# Patient Record
Sex: Male | Born: 1935 | Race: White | Hispanic: No | Smoking: Never smoker
Health system: Southern US, Community
[De-identification: ages and names within clinical notes are randomized; demographics above are authoritative.]

## PROBLEM LIST (undated history)

## (undated) DIAGNOSIS — M1990 Unspecified osteoarthritis, unspecified site: Secondary | ICD-10-CM

## (undated) DIAGNOSIS — G629 Polyneuropathy, unspecified: Secondary | ICD-10-CM

## (undated) DIAGNOSIS — M545 Low back pain, unspecified: Secondary | ICD-10-CM

## (undated) DIAGNOSIS — I10 Essential (primary) hypertension: Secondary | ICD-10-CM

## (undated) DIAGNOSIS — R2689 Other abnormalities of gait and mobility: Secondary | ICD-10-CM

## (undated) DIAGNOSIS — H9190 Unspecified hearing loss, unspecified ear: Secondary | ICD-10-CM

## (undated) DIAGNOSIS — L570 Actinic keratosis: Secondary | ICD-10-CM

## (undated) HISTORY — PX: OTHER SURGICAL HISTORY: SHX169

## (undated) HISTORY — DX: Actinic keratosis: L57.0

## (undated) HISTORY — DX: Polyneuropathy, unspecified: G62.9

## (undated) HISTORY — DX: Low back pain, unspecified: M54.50

## (undated) HISTORY — DX: Other abnormalities of gait and mobility: R26.89

## (undated) HISTORY — DX: Low back pain: M54.5

---

## 1958-05-28 HISTORY — PX: KNEE ARTHROSCOPY: SHX127

## 2005-05-28 HISTORY — PX: BACK SURGERY: SHX140

## 2005-07-26 ENCOUNTER — Encounter: Admission: RE | Admit: 2005-07-26 | Discharge: 2005-07-26 | Payer: Self-pay | Admitting: Orthopedic Surgery

## 2005-09-05 ENCOUNTER — Inpatient Hospital Stay (HOSPITAL_COMMUNITY): Admission: RE | Admit: 2005-09-05 | Discharge: 2005-09-06 | Payer: Self-pay | Admitting: Orthopedic Surgery

## 2006-05-28 HISTORY — PX: TOTAL HIP ARTHROPLASTY: SHX124

## 2006-05-28 HISTORY — PX: CATARACT EXTRACTION: SUR2

## 2006-12-30 IMAGING — CR DG LUMBAR SPINE 2-3V
2 series · 2 of 2 positions shown · non-contrast
Comparison: none

CLINICAL DATA: Spinal stenosis L2-S1.
 LUMBAR SPINE ? 1 VIEW:

[view not recorded (1 of 2)]
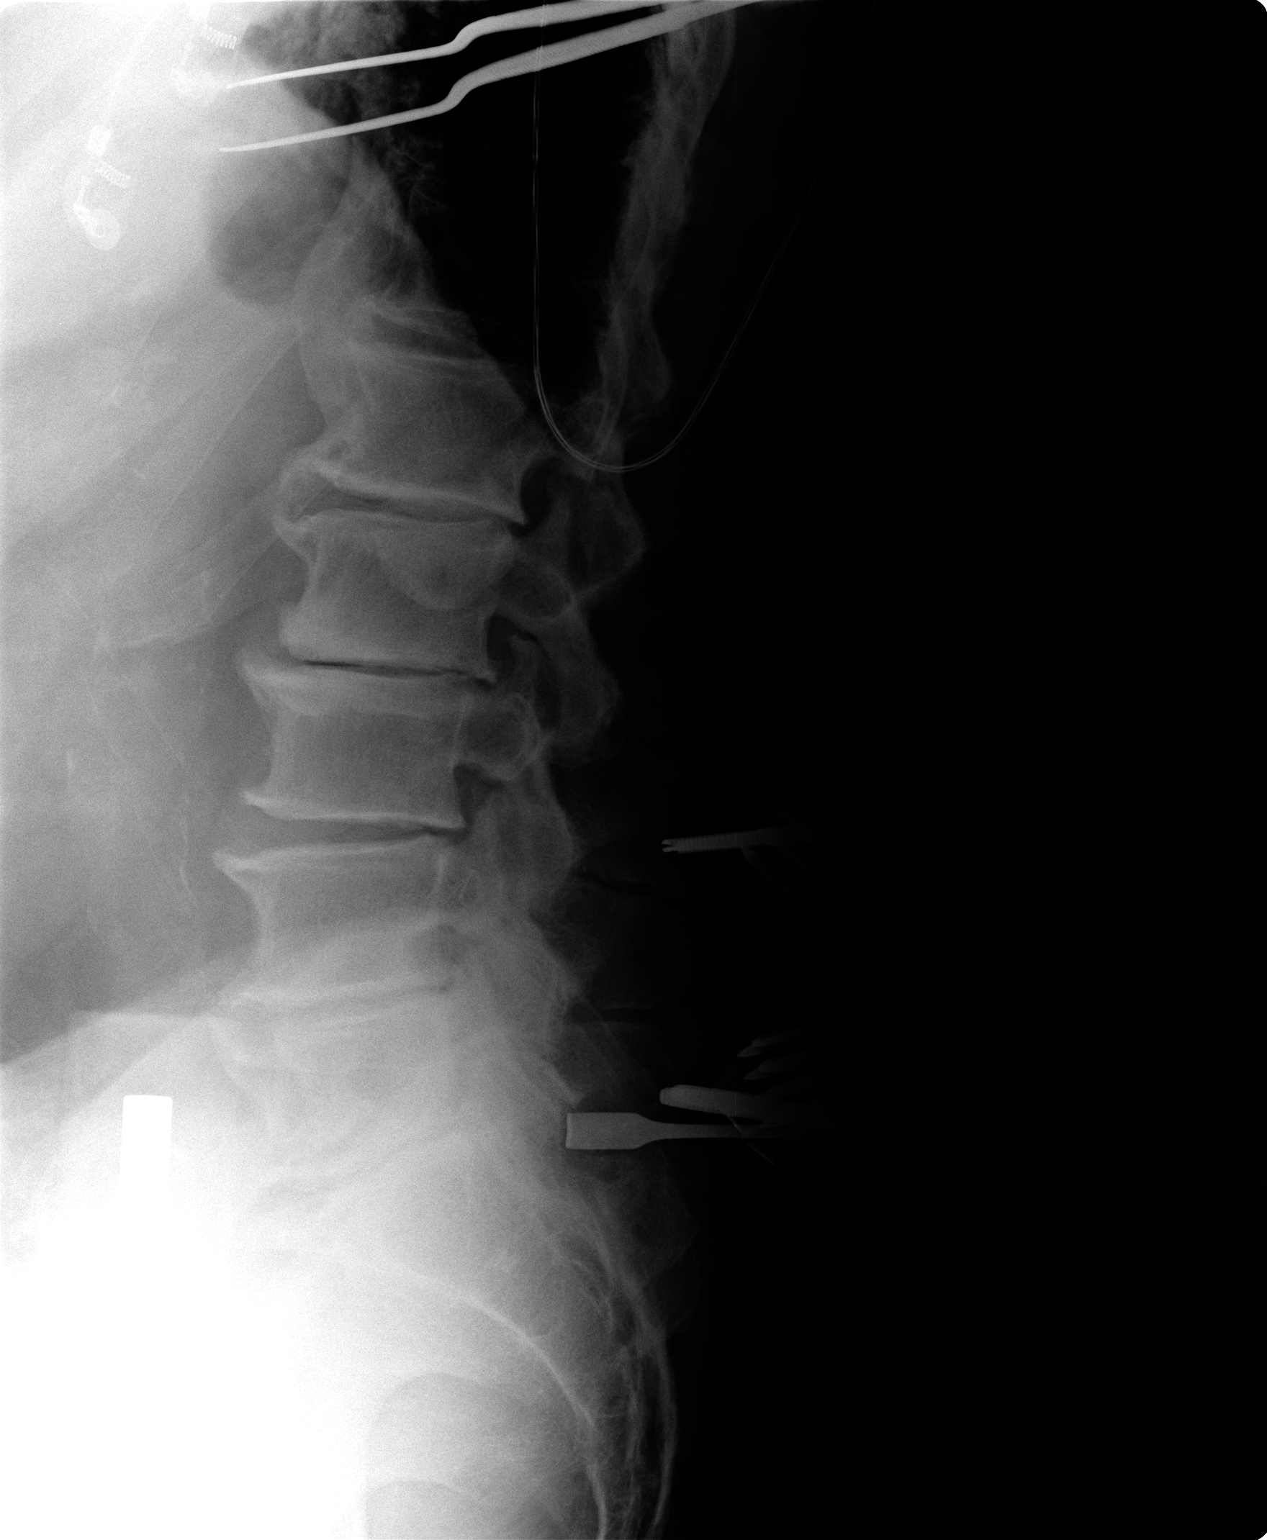

[view not recorded (2 of 2)]
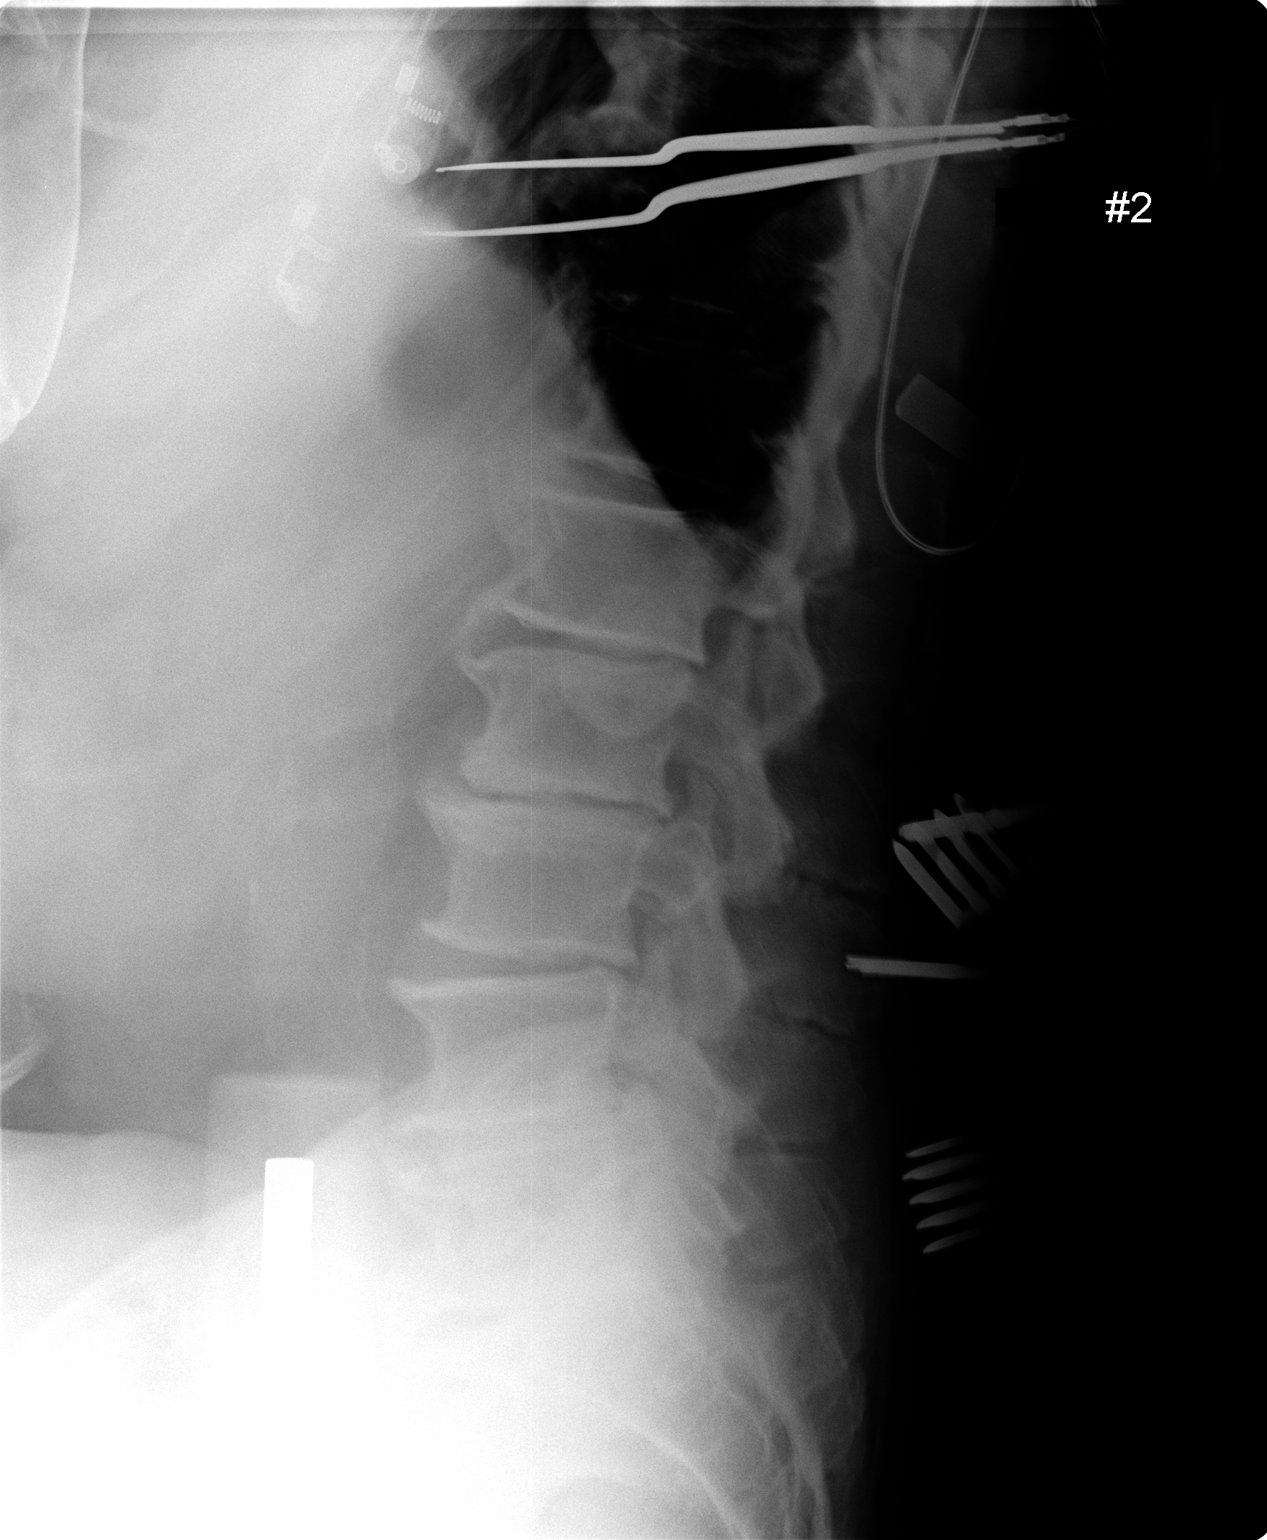

[2 of 2 positions shown; findings below may reference images not displayed]

FINDINGS: Retractors in place and surgical instruments located posteriorly, aimed at L3-4 and L5-S1 interspaces.
IMPRESSION: OR localization at L3-4 and L5-S1.

## 2007-02-05 ENCOUNTER — Ambulatory Visit (HOSPITAL_COMMUNITY): Admission: RE | Admit: 2007-02-05 | Discharge: 2007-02-05 | Payer: Self-pay | Admitting: Orthopedic Surgery

## 2007-08-01 ENCOUNTER — Inpatient Hospital Stay (HOSPITAL_COMMUNITY): Admission: RE | Admit: 2007-08-01 | Discharge: 2007-08-05 | Payer: Self-pay | Admitting: Orthopedic Surgery

## 2008-11-24 IMAGING — CR DG HIP 1V PORT*R*
1 series · 1 of 1 positions shown · non-contrast
Comparison: none

CLINICAL DATA: Right total hip replacement for osteoarthritis.

RIGHT HIP - SINGLE PORTABLE AP VIEW

[view not recorded]
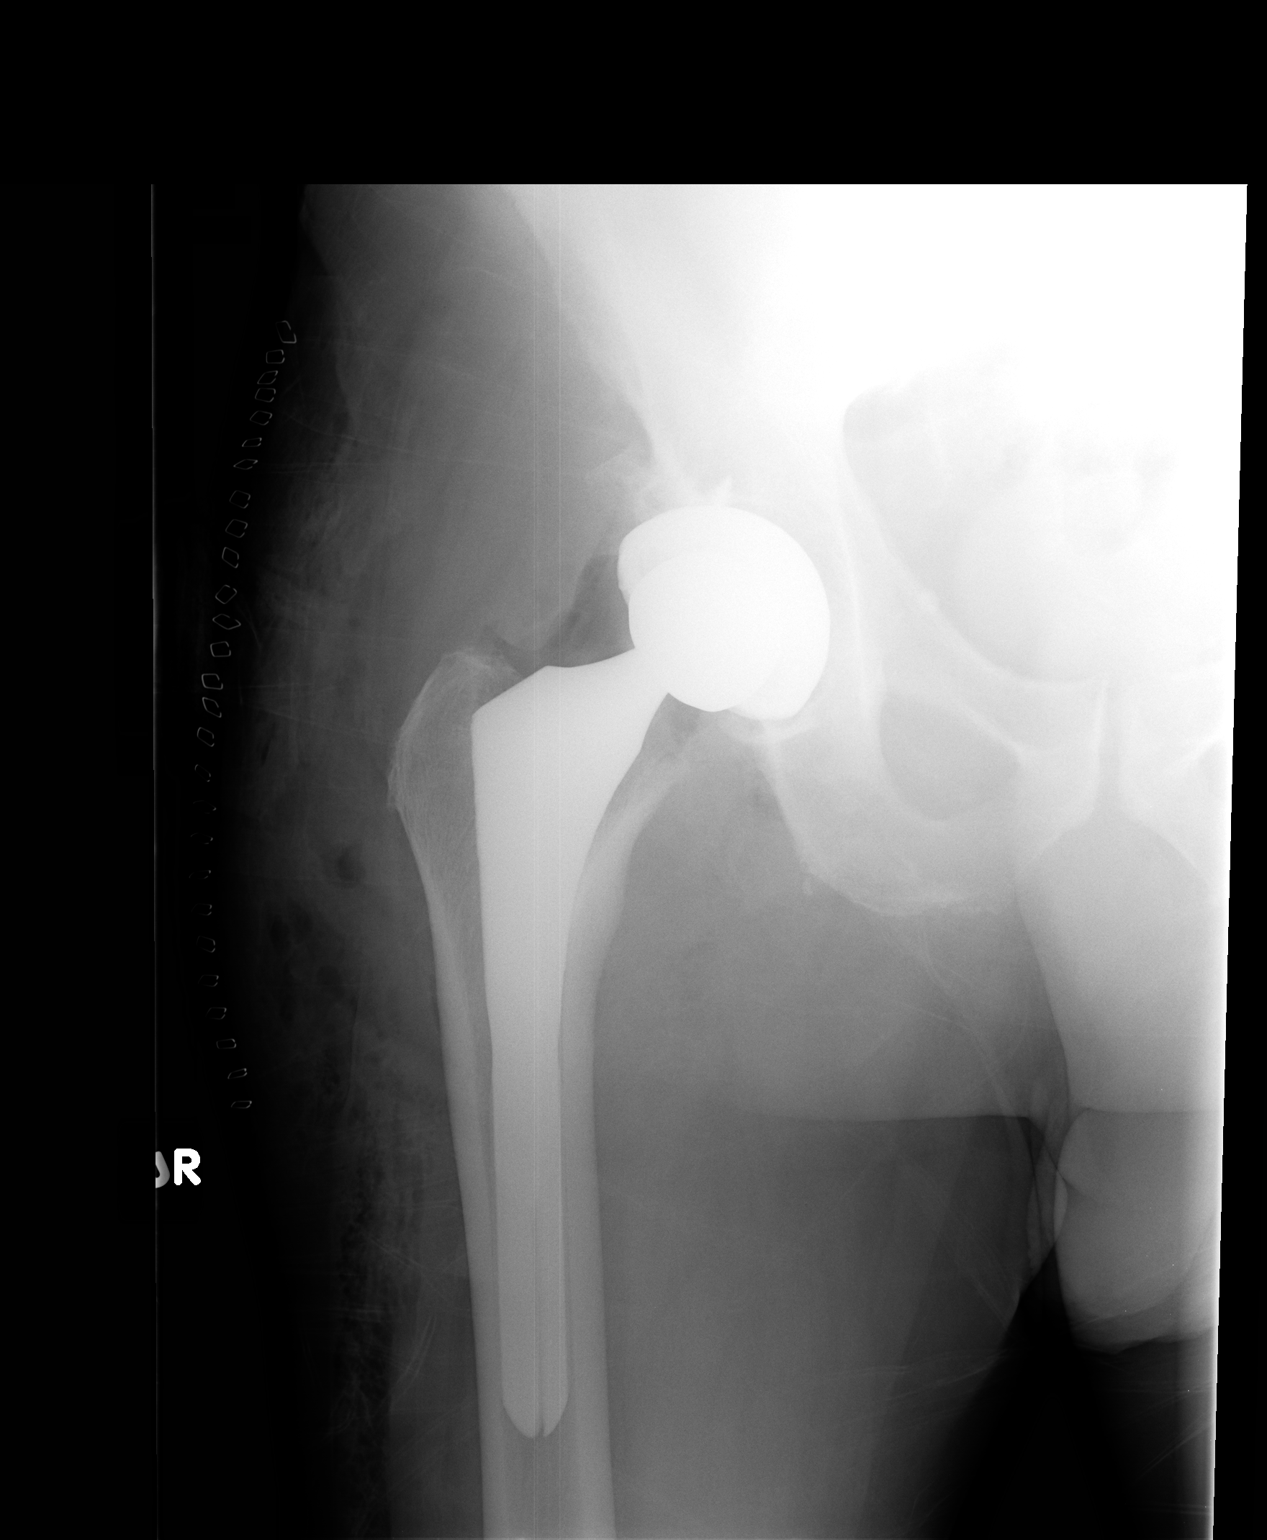

[1 of 1 positions shown; findings below may reference images not displayed]

FINDINGS: Right total hip prosthesis in satisfactory position and alignment. No
fracture or dislocation seen.

IMPRESSION

Satisfactory postoperative appearance of a right total hip prosthesis.

## 2010-05-26 ENCOUNTER — Emergency Department: Payer: Self-pay | Admitting: Emergency Medicine

## 2010-05-28 HISTORY — PX: CARPAL TUNNEL RELEASE: SHX101

## 2010-10-10 NOTE — Op Note (Signed)
Shane Prince, Shane Prince               ACCOUNT NO.:  [id number]   MEDICAL RECORD NO.:  [id number]          PATIENT TYPE:  INP   LOCATION:  1616                         FACILITY:  [facility]   PHYSICIAN:  Marlowe Kays, M.D.  DATE OF BIRTH:  [birth year]   DATE OF PROCEDURE:  08/01/2007  DATE OF DISCHARGE:                               OPERATIVE REPORT   PREOPERATIVE DIAGNOSIS:  Osteoarthritis, right hip.   POSTOPERATIVE DIAGNOSIS:  Osteoarthritis, right hip.   OPERATION:  Osteonics total hip replacement, right.   SURGEON:  Marlowe Kays, M.D.   ASSISTANT:  Georges Lynch. Darrelyn Hillock, M.D.   ANESTHESIA:  General.   INDICATIONS FOR PROCEDURE:  A completely obliterated right hip joint  with significant pain and loss of motion.   PROCEDURE:  Prophylactic antibiotics.  Satisfactory general anesthesia.  Left lateral decubitus position.  Mark II frame.  Right hip was prepped  with DuraPrep and draped in a sterile field.  A time-out performed.  Ioban employed.  Posterolateral incision down through the fascia lata.  External rotators were detached from the femur.  The hip capsule  exposed.  A subtotal capsulectomy performed.  The hip was dislocated.  I  made my initial cut of the femoral neck right below the femoral head.  The piriformis fossa was exposed and cleared of soft tissue.  A guide  pin was placed down through it into the femur, over-drilled with a step-  cut drill followed by a canal finder.  I then began my vertical reaming  process.  Preoperative templating indicated he probably needed a 12 mm  non-smoothing prosthesis.  Even though he is 81, he had excellent bone  stock, and I elected to go with both noncemented femur and acetabular.  I then began the rasping process, working up to #12, and along the way  made my final cut along the femoral neck a fingerbreadth above the  lesser trochanter.  I then reamed for the distal tip up to an 18.5 for  an 18 mm tip.  Moving to the acetabulum, we  then performed a complete  capsulectomy and began the deepening and expanding reaming process,  working up to a 60 mm, which is what we had templated.  I then went  through a trial reduction and found the cup positioned satisfactorily  and accordingly placed the final 60 mm fenestrated cup.  Because of his  hard acetabular bone, placed one bone screw.  I then placed the final  polyethylene liner and went through another trial reduction of a 40 mm  head and -5 neck length, which seemed to give the optimal stability and  length.  Accordingly replaced the final prosthesis, impacting it gently  and actually at this time performed the trial with a -5 head followed by  the actual 40 mm -5 C-tapered head.  The hip was reduced and found to be  nice and stable.  The wound was then irrigated with sterile saline, and  closure performed.  The buttock muscles and fascia lata were  reapproximated with #1 Vicryl.  The subcutaneous tissue with a  combination of #1  and 2-0 Vicryl, staples on the skin, Betadine, and  Adaptic, dry sterile dressings were applied.  He was then gently placed  on his back, and transferred at the same time to his PACU bed.  He was  placed in an abduction pillow.  Toes were up.  Estimated blood loss was  500 cc.  No blood replacement.  No known operative complications.           ______________________________  Marlowe Kays, M.D.     JA/MEDQ  D:  08/01/2007  T:  08/01/2007  Job:  [id number]

## 2010-10-10 NOTE — H&P (Signed)
Shane Prince, Shane Prince              ACCOUNT NO.:  [id number]   MEDICAL RECORD NO.:  [id number]         PATIENT TYPE:  LINP   LOCATION:                               FACILITY:  [facility]   PHYSICIAN:  Marlowe Kays, M.D.  DATE OF BIRTH:  08-08-1926   DATE OF ADMISSION:  08/01/2007  DATE OF DISCHARGE:                              HISTORY & PHYSICAL   CHIEF COMPLAINT:  Pain in my right hip.   PRESENT ILLNESS:  This 77 year old white male has been seen by [place] for  continuing problems concerning his right hip.  He has had progressive  pain into the hip into the thigh area.  He now has developed a limp upon  walking.  X-rays have shown near bone-on-bone deformity of the right  hip.  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  The findings of this is markedly interfering in his day-to-day  activities.  He has had a decompressive lumbar laminectomy for spinal  stenosis, which has relieved a considerable amount of his discomfort in  his back and hip area, but he is continuing with his the right hip pain.  After much discussion including the risks and benefits of surgery, it  was decided he would benefit from surgical intervention and being  admitted for total hip replacement arthroplasty.   He lives alone and maintains his own home, but he does have daughters  who live a short distance away that he can call upon.  I told that I  recommend he have them be with him for the first day or two after  surgery, for his safety.  He will have home health as well.  He already  has a three-in-one commode at home, but we will fit him with a walker  and supply him with a shower seat, during his hospitalization.   PAST MEDICAL HISTORY:  And the patient denies any medical problems.  He  does realize he has hypertension.  He takes it on his own, takes his  blood pressure on his own.  His daughter has bought him an over-the-  counter type herb medicine, Cyrulex Plus, which he has taken for  his  blood pressure.  He also has some mild hemorrhoids.  Osteoarthritis as  well.  As a child, he had mumps and measles.   PAST SURGERIES:  Lumbar laminectomies in 2007 and 2008, knee surgery in  1916 and toe surgery in 2004.   ALLERGIES:  HE DENIES ANY MEDICAL ALLERGIES.   FAMILY HISTORY:  His father died of 26.  Mother died at 69.   SOCIAL HISTORY:  The patient is retired, had never had any intake of  tobacco products, has about 6 ounces of alcohol a day.  He has three  children, lives alone.  He has asked to see if he could go to a rehab  facility, after surgery.  He lives in a one level home.   REVIEW OF SYSTEMS:  CNS:  No seizure or shoulder paralysis, numbness,  double vision.  RESPIRATORY:  No productive cough, no hemoptysis, no  shortness of breath.  CARDIOVASCULAR:  No chest pain, no angina, no  orthopnea.  GASTROINTESTINAL:  No nausea or vomiting, no melena or  bloody stool.  GENITOURINARY:  No discharge, dysuria, hematuria.  MUSCULOSKELETAL:  Primarily in present illness.   PHYSICAL EXAMINATION:  GENERAL:  Alert and cooperative friendly, fully  alert, oriented 77 year old white male who is 6 feet 1,  weighs 235  pounds.  VITAL SIGNS:  Blood pressure seated, left arm 178/98, pulse 76 regular,  respirations 12 unlabored.  HEENT:  Normocephalic, PERRLA.  Oropharynx is clear.  CHEST:  Clear to auscultation, no rhonchi, no rales.  HEART:  Regular rate and rhythm.  Heart sounds are somewhat distant, as  he is barrel-chested.  No murmurs are heard.  ABDOMEN:  Obese, soft, nontender, liver and spleen not felt.  GENITALIA:  Rectal not done, not pertinent to present illness.  EXTREMITIES:  The patient has pain with internal/external rotation of  his right hip, as well as in flexion.  NEUROVASCULAR:  Grossly intact to the right lower extremity.   ADMISSION DIAGNOSIS:  Osteoarthritis of right hip.   PLAN:  The patient to undergo right total hip replacement arthroplasty.  Will  be sure that he has proper durable medical goods for his safe home  return.  If this is not feasible, then we may seek out a rehabilitation  or skilled nursing facility for him to recuperate.      Dooley L. Cherlynn June.    ______________________________  Marlowe Kays, M.D.    DLU/MEDQ  D:  07/18/2007  T:  07/19/2007  Job:  [id number]   cc:   Marlowe Kays, M.D.  Fax: [phone]

## 2010-10-13 NOTE — Op Note (Signed)
Shane Prince, Shane Prince               ACCOUNT NO.:  [id number]   MEDICAL RECORD NO.:  [id number]          PATIENT TYPE:  INP   LOCATION:  0001                         FACILITY:  [facility]   PHYSICIAN:  Marlowe Kays, M.D.  DATE OF BIRTH:  10-28-1926   DATE OF PROCEDURE:  09/05/2005  DATE OF DISCHARGE:                                 OPERATIVE REPORT   PREOPERATIVE DIAGNOSIS:  Central and foraminal stenosis L1-L2, L2-L3, L3-L4,  L4-L5, and L5-S1.   POSTOPERATIVE DIAGNOSIS:  Central and foraminal stenosis L1-L2, L2-L3, L3-  L4, L4-L5, and L5-S1.   OPERATION:  Central lateral recess decompression L1-L2 to the sacrum.   SURGEON:  Marlowe Kays, M.D.   ASSISTANT:  Georges Lynch. Darrelyn Hillock, M.D.   ANESTHESIA:  General.   JUSTIFICATION OF THE PROCEDURE:  The patient is having back and bilateral  leg pain, more on the right thigh.  MRI has demonstrated no disk herniation  but central spinal stenosis mainly at L2-L3 and L3-L4 with foraminal  stenosis at the remaining levels. I followed this with a myelogram  confirming the above diagnoses and consequently, he is here for the above  mentioned surgery.   PROCEDURE:  Satisfactory general anesthesia, prophylactic antibiotics, Foley  catheter inserted, knee-chest position on the Andrews frame, the back was  prepped with DuraPrep and draped in a sterile field.  Ioban employed.  Vertical midline incision down to the spinous processes at this level.  I  tagged two of the spinous process with Kocher clamps and these were  tentatively identified as L3 and L5.  Based on this, I extended the incision  proximally and distally working up to what we thought was L1 and the sacrum.  The soft tissue was dissected off the neural arches and two self-retaining  McCullough retractors inserted.  I took a second x-ray with the Kocher  clamps at what we thought were L2 and L3, and lateral x-ray confirmed this.  We then began central decompression with double action  rongeur and 2 and 3  mm Kerrison rongeurs, working throughout the length of the incision up  through most of the neural arch of L2 with the spinous process of L2 having  been removed, and working down to the sacrum.  I then brought in the  microscope and we completed the decompression, making sure that all the  foramen at each level were patent.  There were no dural injuries or other  iatrogenic complications.  After we thoroughly had decompressed the  pathology, we irrigated the wound well with sterile saline and placed  Gelfoam soaked in thrombin over the dura.  I then closed the wound over a  1/4 inch Penrose drain through the right posterior hip area under direct  visualization to make sure that the drain was not included, with interrupted  #1 Vicryl in the paralumbar muscle and fascia, 2-0  Vicryl in the subcutaneous tissues, staples in the skin.  Betadine and  Adaptic dressing was applied, he was gently placed on his PACU bed, and  taken to the recovery room in stable condition with no complications.  Estimated blood  loss is perhaps 300 mL, no blood replacement.           ______________________________  Marlowe Kays, M.D.     JA/MEDQ  D:  09/05/2005  T:  09/05/2005  Job:  [id number]

## 2010-10-13 NOTE — Discharge Summary (Signed)
Shane Prince, Shane Prince               ACCOUNT NO.:  [id number]   MEDICAL RECORD NO.:  [id number]          PATIENT TYPE:  INP   LOCATION:  1616                         FACILITY:  [facility]   PHYSICIAN:  Marlowe Kays, M.D.  DATE OF BIRTH:  Jun 24, 1926   DATE OF ADMISSION:  08/01/2007  DATE OF DISCHARGE:  08/05/2007                               DISCHARGE SUMMARY   ADMITTING DIAGNOSES:  Osteoarthritis of the right hip.   DISCHARGE DIAGNOSES:  Mild postoperative anemia, hypertension episode  and transient constipation postop.   OPERATION:  On August 01, 2007, the patient underwent Osteonics total hip  replacement arthroplasty of the right hip.   ASSISTANT:  Georges Lynch. Gioffre.   BRIEF HISTORY:  This 77 year old male is seen by [place] for problems  concerning his right hip.  He has now developed an antalgic gait and x-  rays showed near bone-on-bone deformity of the right hip.  He is a very  active gentleman and the pain in his hip is markedly interfering with  his day-to-day activity.  After much discussion of the risks and  benefits of surgery, it was decided he would benefit from the above  procedure and was scheduled for same.   COURSE IN THE HOSPITAL:  The patient tolerated the surgical procedure  quite well.  He was placed on the total hip protocol postoperatively.  He had inpatient physical therapy which assisted him in comfort and  confidence with his activities.  Hemoglobin dropped to 9.4  postoperatively.  He was stable and was asymptomatic, so it was decided  not to transfuse him.  Once home health was arranged through [facility], as  well as equipment, it was decided he could be maintained in his home  environment and arrangements were made for discharge.  Finally, he had  postoperative constipation, which was relieved with laxatives.  He is  voiding well.  The wound was clean and dry.  Neurovascular is intact to  the operative extremity.  He was discharged home.   LABORATORY VALUES:   In the hospital hematologically showed a  preoperative hemoglobin of 16.1 and hematocrit of 45.9.  Final  hemoglobin was 9.6.  Blood chemistries were normal.  Electrocardiogram  showed normal sinus rhythm and normal ECG.  He had no chest x-ray seen  on this chart.  The postoperative hip x-ray showed satisfactory  postoperative appearance of the right total hip prosthesis.   CONDITION ON DISCHARGE:  Improved, stable.   PLAN:  The patient is discharged to his home.  Continue with his home  medications and diet.   MEDICATIONS:  The only medication he was taking at home was __________  Plus, which is an over-the-counter herbal medication.  We gave him Tylox  for discomfort, Robaxin for muscle spasms, and Coumadin per pharmacy  protocol for 4 weeks after the date of surgery for the prevention of  DVT.  He is to come to the office 2 weeks after date of surgery.  Continue with his home diet.      Dooley L. Cherlynn June.    ______________________________  Marlowe Kays, M.D.  DLU/MEDQ  D:  08/16/2007  T:  08/16/2007  Job:  [id number]   cc:   Marlowe Kays, M.D.  Fax: [phone]

## 2010-10-13 NOTE — H&P (Signed)
Shane Prince, BACALLAO               ACCOUNT NO.:  [id number]   MEDICAL RECORD NO.:  [id number]          PATIENT TYPE:  INP   LOCATION:  NA                           FACILITY:  [facility]   PHYSICIAN:  Marlowe Kays, M.D.  DATE OF BIRTH:  05-18-1927   DATE OF ADMISSION:  DATE OF DISCHARGE:                                HISTORY & PHYSICAL   CHIEF COMPLAINT:  Pain in my legs.   HISTORY OF PRESENT ILLNESS:  This 77 year old white male with persistent  pain in his lower extremities.  He has had some back pain but the majority  of his discomfort is in the lower extremities, more so on the right than the  left.  He has tried to put up with this pain for an extended period of time,  but he is a very active gentleman who enjoys the outdoors and is now finding  this pain to be to the point where he can only stand direct with slight  extension to have any kind of relief from his pain and discomfort.  He has  undergone a lumbar myelogram and CT scan and severe spinal stenosis is seen  particularly at L2-L3, L3-L4 in a central area but also has severe foraminal  stenosis at L4-L5 with foraminal stenosis seen at L5-S1.  After much  discussion including the risks and benefits of surgery, he has decided to go  ahead with surgical intervention and is being admitted for a central and  foraminal decompressive lumbar laminectomy from L2-S1.   PAST MEDICAL HISTORY:  This gentleman reports that he has been in relatively  good health throughout his lifetime.  He states that he has hypertension and  had seen a physician in [place] in the past.  Apparently that physician  tried him on various antihypertensives.  He did not like the side effects of  those medications, therefore, he is not taking any and does not use that  physician any more.  He prefers to use exercise to control his blood  pressure.   He denies any drug allergies.   He is taking no medications other than Percocet at this time.   PAST  SURGERY:  1.  Knee arthroscopy of his right knee for a torn cartilage back in 1960.  2.  Toe surgery on the right in 2005.   FAMILY HISTORY:  Positive for arthritis in the mother.   SOCIAL HISTORY:  The patient is single, retired, no intake of tobacco  products.  He has 6 ounces of alcohol per day.  He has three daughters  Eliberto Ivory, and Inetta Fermo who will take care of him after his surgery.   REVIEW OF SYSTEMS:  CNS:  No seizure disorder, paralysis, numbness, double  vision.  RESPIRATORY:  No productive cough, no hemoptysis, no shortness of  breath.  CARDIOVASCULAR:  No chest pain, no angina, no orthopnea.  GASTROINTESTINAL:  No nausea, vomiting, melena, bloody stools.  GENITOURINARY:  No discharge, dysuria, hematuria.  MUSCULOSKELETAL:  Primarily in present illness.  He does have some right knee pain consistent  with probable osteoarthritis.   PHYSICAL EXAMINATION:  GENERAL:  He is alert, cooperative, friendly , 26-  year-old white male.  VITAL SIGNS:  Blood pressure  160/92, pulse 80, respirations 12.  HEENT:  He has a ruddy complexion.  PERLA.  EOMs intact.  Oropharynx is  clear.  CHEST:  Clear to auscultation.  No rhonchi.  No rales.  HEART:  Regular rate and rhythm.  No murmurs are heard.  ABDOMEN:  Soft, nontender.  Liver spleen not felt.  GENITALIA/RECTAL:  Not done.  Not pertinent to present illness.  EXTREMITIES:  Negative straight leg raise bilaterally.  No gross weakness.  The patient has some mild swelling to the right knee and crepitus on range  of motion.   ADMITTING DIAGNOSIS:  1.  Spinal stenosis, L2-S1.  2.  Hypertension.  3.  Probable osteoarthritis of the right knee.   PLAN:  The patient will undergo decompressive lumbar laminectomy from L2-S1.  We may need to pay close attention to his elevated blood pressure  postoperatively and may need to start him on antihypertensive until he gets  a regular doctor or even call for a consult.  This of course would depend  on  his postoperative blood pressures.      Dooley L. Cherlynn June.    ______________________________  Marlowe Kays, M.D.    DLU/MEDQ  D:  08/24/2005  T:  08/25/2005  Job:  [id number]

## 2011-02-16 LAB — BASIC METABOLIC PANEL
BUN: 10
Calcium: 9.2
Chloride: 105
GFR calc Af Amer: >60

## 2011-02-16 LAB — HEMOGLOBIN AND HEMATOCRIT, BLOOD: HCT: 45.9

## 2011-02-19 LAB — CBC
HCT: 26.6 — ABNORMAL LOW
HCT: 36.2 — ABNORMAL LOW
Hemoglobin: 12.8 — ABNORMAL LOW
MCHC: 35.3
MCHC: 35.4
MCV: 88.9
MCV: 89.3
Platelets: 133 — ABNORMAL LOW
RBC: 2.99 — ABNORMAL LOW
RDW: 13.3
WBC: 13.8 — ABNORMAL HIGH
WBC: 9.2

## 2011-02-19 LAB — PROTIME-INR
INR: 1.2
INR: 1.2
INR: 2.3 — ABNORMAL HIGH
Prothrombin Time: 15.1

## 2011-02-19 LAB — BASIC METABOLIC PANEL
Chloride: 103
GFR calc Af Amer: 59 — ABNORMAL LOW
Sodium: 136

## 2011-02-19 LAB — HEMOGLOBIN AND HEMATOCRIT, BLOOD: HCT: 40.1

## 2011-11-01 ENCOUNTER — Other Ambulatory Visit (HOSPITAL_COMMUNITY): Payer: Self-pay | Admitting: Orthopedic Surgery

## 2011-11-01 DIAGNOSIS — M25559 Pain in unspecified hip: Secondary | ICD-10-CM

## 2011-11-12 ENCOUNTER — Encounter (HOSPITAL_COMMUNITY)
Admission: RE | Admit: 2011-11-12 | Discharge: 2011-11-12 | Disposition: A | Discharge status: Home / Self Care | Payer: Medicare PPO | Source: Ambulatory Visit | Attending: Orthopedic Surgery | Admitting: Orthopedic Surgery

## 2011-11-12 DIAGNOSIS — M25559 Pain in unspecified hip: Secondary | ICD-10-CM

## 2011-11-12 DIAGNOSIS — M47817 Spondylosis without myelopathy or radiculopathy, lumbosacral region: Secondary | ICD-10-CM | POA: Insufficient documentation

## 2011-11-12 DIAGNOSIS — Z96649 Presence of unspecified artificial hip joint: Secondary | ICD-10-CM | POA: Insufficient documentation

## 2011-11-12 MED ORDER — TECHNETIUM TC 99M MEDRONATE IV KIT
25.0000 | PACK | Freq: Once | INTRAVENOUS | Status: AC | PRN
  Administered 2011-11-12: 25 via INTRAVENOUS

## 2011-11-20 ENCOUNTER — Encounter (HOSPITAL_BASED_OUTPATIENT_CLINIC_OR_DEPARTMENT_OTHER): Payer: Self-pay | Admitting: *Deleted

## 2011-11-20 ENCOUNTER — Other Ambulatory Visit: Payer: Self-pay | Admitting: Orthopedic Surgery

## 2011-11-20 NOTE — Progress Notes (Addendum)
To [facility] at 1215-Hg,CXR on arrival-Ekg with chart.Npo after Mn-clear liquids until 0800,then Npo.Pre-op orders pending from Dr.Aplington.

## 2011-11-27 ENCOUNTER — Encounter (HOSPITAL_BASED_OUTPATIENT_CLINIC_OR_DEPARTMENT_OTHER)
Admission: RE | Disposition: A | Discharge status: Home / Self Care | Payer: Self-pay | Source: Ambulatory Visit | Attending: Orthopedic Surgery

## 2011-11-27 ENCOUNTER — Ambulatory Visit (HOSPITAL_BASED_OUTPATIENT_CLINIC_OR_DEPARTMENT_OTHER): Payer: Medicare PPO | Admitting: Anesthesiology

## 2011-11-27 ENCOUNTER — Encounter (HOSPITAL_BASED_OUTPATIENT_CLINIC_OR_DEPARTMENT_OTHER): Payer: Self-pay | Admitting: *Deleted

## 2011-11-27 ENCOUNTER — Ambulatory Visit (HOSPITAL_BASED_OUTPATIENT_CLINIC_OR_DEPARTMENT_OTHER)
Admission: RE | Admit: 2011-11-27 | Discharge: 2011-11-27 | Disposition: A | Discharge status: Home / Self Care | Payer: Medicare PPO | Source: Ambulatory Visit | Attending: Orthopedic Surgery | Admitting: Orthopedic Surgery

## 2011-11-27 ENCOUNTER — Encounter (HOSPITAL_BASED_OUTPATIENT_CLINIC_OR_DEPARTMENT_OTHER): Payer: Self-pay | Admitting: Anesthesiology

## 2011-11-27 ENCOUNTER — Ambulatory Visit (HOSPITAL_COMMUNITY): Payer: Medicare PPO

## 2011-11-27 DIAGNOSIS — G5600 Carpal tunnel syndrome, unspecified upper limb: Secondary | ICD-10-CM | POA: Insufficient documentation

## 2011-11-27 DIAGNOSIS — I10 Essential (primary) hypertension: Secondary | ICD-10-CM | POA: Insufficient documentation

## 2011-11-27 DIAGNOSIS — Z9889 Other specified postprocedural states: Secondary | ICD-10-CM

## 2011-11-27 DIAGNOSIS — Z96649 Presence of unspecified artificial hip joint: Secondary | ICD-10-CM | POA: Insufficient documentation

## 2011-11-27 DIAGNOSIS — G5620 Lesion of ulnar nerve, unspecified upper limb: Secondary | ICD-10-CM | POA: Insufficient documentation

## 2011-11-27 HISTORY — DX: Unspecified hearing loss, unspecified ear: H91.90

## 2011-11-27 HISTORY — PX: ULNAR NERVE TRANSPOSITION: SHX2595

## 2011-11-27 HISTORY — PX: CARPAL TUNNEL RELEASE: SHX101

## 2011-11-27 HISTORY — DX: Essential (primary) hypertension: I10

## 2011-11-27 SURGERY — CARPAL TUNNEL RELEASE
Anesthesia: General | Site: Hand | Laterality: Right | Wound class: Clean

## 2011-11-27 MED ORDER — LIDOCAINE HCL (CARDIAC) 20 MG/ML IV SOLN
INTRAVENOUS | Status: DC | PRN
  Administered 2011-11-27: 80 mg via INTRAVENOUS

## 2011-11-27 MED ORDER — PROPOFOL 10 MG/ML IV EMUL
INTRAVENOUS | Status: DC | PRN
  Administered 2011-11-27: 300 mg via INTRAVENOUS

## 2011-11-27 MED ORDER — POVIDONE-IODINE 7.5 % EX SOLN: Freq: Once | CUTANEOUS | Status: DC

## 2011-11-27 MED ORDER — OXYCODONE-ACETAMINOPHEN 7.5-325 MG PO TABS: 1.0000 | ORAL_TABLET | ORAL | Status: AC | PRN

## 2011-11-27 MED ORDER — GLYCOPYRROLATE 0.2 MG/ML IJ SOLN
INTRAMUSCULAR | Status: DC | PRN
  Administered 2011-11-27: 0.2 mg via INTRAVENOUS

## 2011-11-27 MED ORDER — CEFAZOLIN SODIUM 1-5 GM-% IV SOLN
INTRAVENOUS | Status: DC | PRN
  Administered 2011-11-27: 2 g via INTRAVENOUS

## 2011-11-27 MED ORDER — FENTANYL CITRATE 0.05 MG/ML IJ SOLN: 25.0000 ug | INTRAMUSCULAR | Status: DC | PRN

## 2011-11-27 MED ORDER — FENTANYL CITRATE 0.05 MG/ML IJ SOLN
INTRAMUSCULAR | Status: DC | PRN
  Administered 2011-11-27: 25 ug via INTRAVENOUS
  Administered 2011-11-27: 50 ug via INTRAVENOUS
  Administered 2011-11-27: 25 ug via INTRAVENOUS
  Administered 2011-11-27: 50 ug via INTRAVENOUS
  Administered 2011-11-27 (×2): 25 ug via INTRAVENOUS

## 2011-11-27 MED ORDER — LACTATED RINGERS IV SOLN
INTRAVENOUS | Status: DC
  Administered 2011-11-27 (×3): via INTRAVENOUS

## 2011-11-27 MED ORDER — DEXAMETHASONE SODIUM PHOSPHATE 4 MG/ML IJ SOLN
INTRAMUSCULAR | Status: DC | PRN
  Administered 2011-11-27: 10 mg via INTRAVENOUS

## 2011-11-27 MED ORDER — ONDANSETRON HCL 4 MG/2ML IJ SOLN
INTRAMUSCULAR | Status: DC | PRN
  Administered 2011-11-27: 4 mg via INTRAVENOUS

## 2011-11-27 SURGICAL SUPPLY — 69 items
BANDAGE CONFORM 3  STR LF (GAUZE/BANDAGES/DRESSINGS) ×4 IMPLANT
BANDAGE ELASTIC 3 VELCRO ST LF (GAUZE/BANDAGES/DRESSINGS) ×1 IMPLANT
BANDAGE ELASTIC 4 VELCRO ST LF (GAUZE/BANDAGES/DRESSINGS) ×3 IMPLANT
BLADE SURG 15 STRL LF DISP TIS (BLADE) ×2 IMPLANT
BLADE SURG 15 STRL SS (BLADE) ×3
BNDG CMPR 9X4 STRL LF SNTH (GAUZE/BANDAGES/DRESSINGS) ×2
BNDG CMPR MD 5X2 ELC HKLP STRL (GAUZE/BANDAGES/DRESSINGS) ×2
BNDG ELASTIC 2 VLCR STRL LF (GAUZE/BANDAGES/DRESSINGS) ×1 IMPLANT
BNDG ESMARK 4X9 LF (GAUZE/BANDAGES/DRESSINGS) ×3 IMPLANT
CANISTER SUCTION 1200CC (MISCELLANEOUS) IMPLANT
CANISTER SUCTION 2500CC (MISCELLANEOUS) IMPLANT
CLOTH BEACON ORANGE TIMEOUT ST (SAFETY) ×3 IMPLANT
CORDS BIPOLAR (ELECTRODE) ×3 IMPLANT
COVER TABLE BACK 60X90 (DRAPES) ×3 IMPLANT
DRAPE EXTREMITY T 121X128X90 (DRAPE) ×3 IMPLANT
DRAPE LG THREE QUARTER DISP (DRAPES) ×6 IMPLANT
DRAPE U-SHAPE 47X51 STRL (DRAPES) ×3 IMPLANT
DRSG EMULSION OIL 3X3 NADH (GAUZE/BANDAGES/DRESSINGS) ×3 IMPLANT
DRSG PAD ABDOMINAL 8X10 ST (GAUZE/BANDAGES/DRESSINGS) ×1 IMPLANT
DURAPREP 26ML APPLICATOR (WOUND CARE) ×3 IMPLANT
ELECT REM PT RETURN 9FT ADLT (ELECTROSURGICAL)
ELECTRODE REM PT RTRN 9FT ADLT (ELECTROSURGICAL) IMPLANT
GAUZE SPONGE 4X4 12PLY STRL LF (GAUZE/BANDAGES/DRESSINGS) IMPLANT
GLOVE BIOGEL PI IND STRL 8 (GLOVE) ×2 IMPLANT
GLOVE BIOGEL PI INDICATOR 8 (GLOVE) ×1
GLOVE ECLIPSE 6.5 STRL STRAW (GLOVE) ×1 IMPLANT
GLOVE ECLIPSE 8.0 STRL XLNG CF (GLOVE) ×3 IMPLANT
GLOVE INDICATOR 6.5 STRL GRN (GLOVE) ×1 IMPLANT
GLOVE INDICATOR 8.0 STRL GRN (GLOVE) ×6 IMPLANT
GOWN PREVENTION PLUS LG XLONG (DISPOSABLE) ×3 IMPLANT
GOWN STRL REIN XL XLG (GOWN DISPOSABLE) ×3 IMPLANT
GOWN W/COTTON TOWEL STD LRG (GOWNS) ×3 IMPLANT
GOWN XL W/COTTON TOWEL STD (GOWNS) ×3 IMPLANT
MARKER SKIN DUAL TIP RULER LAB (MISCELLANEOUS) ×3 IMPLANT
NEEDLE 27GAX1X1/2 (NEEDLE) IMPLANT
NEEDLE HYPO 22GX1.5 SAFETY (NEEDLE) IMPLANT
NS IRRIG 500ML POUR BTL (IV SOLUTION) ×3 IMPLANT
PACK BASIN DAY SURGERY FS (CUSTOM PROCEDURE TRAY) ×3 IMPLANT
PAD CAST 3X4 CTTN HI CHSV (CAST SUPPLIES) IMPLANT
PAD CAST 4YDX4 CTTN HI CHSV (CAST SUPPLIES) IMPLANT
PADDING CAST ABS 4INX4YD NS (CAST SUPPLIES) ×1
PADDING CAST ABS COTTON 4X4 ST (CAST SUPPLIES) ×2 IMPLANT
PADDING CAST COTTON 3X4 STRL (CAST SUPPLIES)
PADDING CAST COTTON 4X4 STRL (CAST SUPPLIES) ×6
PENCIL BUTTON HOLSTER BLD 10FT (ELECTRODE) IMPLANT
SPLINT PLASTER CAST XFAST 3X15 (CAST SUPPLIES) IMPLANT
SPLINT PLASTER CAST XFAST 4X15 (CAST SUPPLIES) IMPLANT
SPLINT PLASTER XTRA FAST SET 4 (CAST SUPPLIES) ×10
SPLINT PLASTER XTRA FASTSET 3X (CAST SUPPLIES)
SPONGE GAUZE 2X2 8PLY STRL LF (GAUZE/BANDAGES/DRESSINGS) IMPLANT
SPONGE GAUZE 4X4 12PLY (GAUZE/BANDAGES/DRESSINGS) ×3 IMPLANT
STOCKINETTE 4X48 STRL (DRAPES) ×3 IMPLANT
SUCTION FRAZIER TIP 10 FR DISP (SUCTIONS) IMPLANT
SUT ETHILON 4 0 PS 2 18 (SUTURE) ×8 IMPLANT
SUT ETHILON 5 0 PS 2 18 (SUTURE) IMPLANT
SUT VIC AB 2-0 SH 27 (SUTURE) ×3
SUT VIC AB 2-0 SH 27XBRD (SUTURE) IMPLANT
SUT VIC AB 2-0 UR6 27 (SUTURE) ×1 IMPLANT
SUT VIC AB 3-0 PS2 18 (SUTURE) ×3
SUT VIC AB 3-0 PS2 18XBRD (SUTURE) IMPLANT
SUT VIC AB 4-0 SH 27 (SUTURE) ×3
SUT VIC AB 4-0 SH 27XANBCTRL (SUTURE) IMPLANT
SYR BULB 3OZ (MISCELLANEOUS) ×2 IMPLANT
SYR BULB IRRIGATION 50ML (SYRINGE) ×1 IMPLANT
SYR CONTROL 10ML LL (SYRINGE) IMPLANT
TOWEL OR 17X24 6PK STRL BLUE (TOWEL DISPOSABLE) ×6 IMPLANT
TUBE CONNECTING 12X1/4 (SUCTIONS) IMPLANT
UNDERPAD 30X30 INCONTINENT (UNDERPADS AND DIAPERS) ×3 IMPLANT
WATER STERILE IRR 500ML POUR (IV SOLUTION) ×3 IMPLANT

## 2011-11-27 NOTE — Anesthesia Procedure Notes (Signed)
Procedure Name: LMA Insertion Date/Time: 11/27/2011 2:38 PM Performed by: Fran Lowes Pre-anesthesia Checklist: Patient identified, Emergency Drugs available, Suction available and Patient being monitored Patient Re-evaluated:Patient Re-evaluated prior to inductionOxygen Delivery Method: Circle System Utilized Preoxygenation: Pre-oxygenation with 100% oxygen Intubation Type: IV induction Ventilation: Mask ventilation without difficulty LMA: LMA inserted LMA Size: 4.0 Number of attempts: 1 Airway Equipment and Method: bite block Placement Confirmation: positive ETCO2 Tube secured with: Tape Dental Injury: Teeth and Oropharynx as per pre-operative assessment

## 2011-11-27 NOTE — Transfer of Care (Signed)
Immediate Anesthesia Transfer of Care Note  Patient: Shane Prince  Procedure(s) Performed: Procedure(s) (LRB): CARPAL TUNNEL RELEASE (Right) ULNAR NERVE DECOMPRESSION/TRANSPOSITION (Right)  Patient Location: Patient transported to PACU with oxygen via face mask at 4 Liters / Min  Anesthesia Type: General  Level of Consciousness: awake and alert   Airway & Oxygen Therapy: Patient Spontanous Breathing and Patient connected to face mask oxygen  Post-op Assessment: Report given to PACU RN and Post -op Vital signs reviewed and stable  Post vital signs: Reviewed and stable  Dentition: Teeth and oropharynx remain in pre-op condition  Complications: No apparent anesthesia complications

## 2011-11-27 NOTE — Anesthesia Preprocedure Evaluation (Signed)
Anesthesia Evaluation  Patient identified by MRN, date of birth, ID band Patient awake    Reviewed: Allergy & Precautions, H&P , NPO status , Patient's Chart, lab work & pertinent test results, reviewed documented beta blocker date and time   Airway Mallampati: I TM Distance: >3 FB Neck ROM: Full    Dental  (+) Teeth Intact and Dental Advisory Given   Pulmonary neg pulmonary ROS,  breath sounds clear to auscultation        Cardiovascular hypertension, Rhythm:Regular Rate:Normal  Pt says he can't tolerate BP meds, says BP usually 150/90 Denies cardiac symptoms   Neuro/Psych negative neurological ROS  negative psych ROS   GI/Hepatic negative GI ROS, Neg liver ROS,   Endo/Other  negative endocrine ROS  Renal/GU negative Renal ROS  negative genitourinary   Musculoskeletal   Abdominal   Peds negative pediatric ROS (+)  Hematology negative hematology ROS (+)   Anesthesia Other Findings Upper front caps Bridges on side  Reproductive/Obstetrics negative OB ROS                           Anesthesia Physical Anesthesia Plan  ASA: II  Anesthesia Plan: General   Post-op Pain Management:    Induction: Intravenous  Airway Management Planned: LMA  Additional Equipment:   Intra-op Plan:   Post-operative Plan: Extubation in OR  Informed Consent: I have reviewed the patients History and Physical, chart, labs and discussed the procedure including the risks, benefits and alternatives for the proposed anesthesia with the patient or authorized representative who has indicated his/her understanding and acceptance.   Dental advisory given  Plan Discussed with: CRNA and Surgeon  Anesthesia Plan Comments:         Anesthesia Quick Evaluation

## 2011-11-27 NOTE — H&P (Signed)
Shane Prince is an 78 y.o. male.   Chief Complaint: pain and paresthesias rt hand HPI:EMG's and NCV's demonstrate median neuropathy at rt wrist and delayed ulnar nerve conduction at rt elbow  Past Medical History  Diagnosis Date  . Hypertension     denies medication use  . HOH (hard of hearing) left    doesn't use hearing aid    Past Surgical History  Procedure Date  . Joint replacement 2009    rt total hip  . Eye surgery 2008    lt cataract extraction  . Back surgery 2007    L3-4,5  . Carpal tunnel 2012    left  . Knee arthroscopy 1960    rt     History reviewed. No pertinent family history. Social History:  reports that he has never smoked. He does not have any smokeless tobacco history on file. He reports that he drinks about 4.2 ounces of alcohol per week. His drug history not on file.  Allergies: No Known Allergies  Medications Prior to Admission  Medication Sig Dispense Refill  . naproxen sodium (ANAPROX) 220 MG tablet Take 220 mg by mouth as needed.        No results found for this or any previous visit (from the past 48 hour(s)). Dg Chest 2 View  11/27/2011  *RADIOLOGY REPORT*  Clinical Data: Preop.  Nonsmoker.  CHEST - 2 VIEW  Comparison: 07/24/2007  Findings: Cardiomediastinal silhouette is within normal limits. The lungs are free of focal consolidations and pleural effusions. Multiple pleural calcifications are present.  Degenerative changes are seen in the spine.  IMPRESSION:  1.  Stable pleural calcifications. 2. No evidence for acute cardiopulmonary abnormality.  Original Report Authenticated By: Patterson Hammersmith, M.D.    ROS  Blood pressure 177/98, pulse 65, temperature 97.6 F (36.4 C), temperature source Oral, resp. rate 18, height 6' (1.829 m), weight 104.327 kg (230 lb), SpO2 97.00%. Physical Exam  Constitutional: He is oriented to person, place, and time. He appears well-developed and well-nourished.  HENT:  Head: Normocephalic and atraumatic.    Right Ear: External ear normal.  Left Ear: External ear normal.  Nose: Nose normal.  Mouth/Throat: Oropharynx is clear and moist.  Eyes: Conjunctivae and EOM are normal. Pupils are equal, round, and reactive to light.  Neck: Normal range of motion. Neck supple.  Cardiovascular: Normal rate, regular rhythm, normal heart sounds and intact distal pulses.   Respiratory: Effort normal and breath sounds normal.  GI: Soft. Bowel sounds are normal.  Musculoskeletal: Normal range of motion.       Decreased sensation all digits rt hand  Neurological: He is alert and oriented to person, place, and time. He has normal reflexes.  Skin: Skin is warm and dry.  Psychiatric: He has a normal mood and affect. His behavior is normal. Judgment and thought content normal.     Assessment/Plan Median neuropathy rt wrist and ulnar neuropathy rt elbow Decompression median nerve rt wrist and hand and decompression ulnar nerve rt elbow  APLINGTON,[name] P 11/27/2011, 2:08 PM

## 2011-11-27 NOTE — Brief Op Note (Signed)
11/27/2011  4:29 PM  PATIENT:  Shane Prince  78 y.o. male  PRE-OPERATIVE DIAGNOSIS:  RIGHT CARPAL TUNNEL SYNDROME AND RIGHT ELBOW ULNAR NEUROPATHY OF THE RIGHT ELBOW  POST-OPERATIVE DIAGNOSIS:  RIGHT CARPAL TUNNEL SYNDROME AND RIGHT ELBOW ULNAR NEUROPATHY OF THE RIGHT ELBOW  PROCEDURE:  Procedure(s) (LRB): CARPAL TUNNEL RELEASE (Right) ULNAR NERVE DECOMPRESSION/TRANSPOSITION (Right)  SURGEON:  Surgeon(s) and Role:    * Drucilla Schmidt, MD - Primary  PHYSICIAN ASSISTANT:   ASSISTANTS:nurse   ANESTHESIA:   general  EBL:  Total I/O In: 1300 [I.V.:1300] Out: -   BLOOD ADMINISTERED:none  DRAINS: none   LOCAL MEDICATIONS USED:  NONE  SPECIMEN:  No Specimen  DISPOSITION OF SPECIMEN:  N/A  COUNTS:  YES  TOURNIQUET:   Total Tourniquet Time Documented: Upper Arm (Right) - 61 minutes  DICTATION: .Other Dictation: Dictation Number [id number]  PLAN OF CARE: Discharge to home after PACU  PATIENT DISPOSITION:  PACU - hemodynamically stable.   Delay start of Pharmacological VTE agent (>24hrs) due to surgical blood loss or risk of bleeding: not applicable

## 2011-11-28 NOTE — Op Note (Signed)
NAMEABENEZER, ODONELL               ACCOUNT NO.:  [id number]  MEDICAL RECORD NO.:  [id number]  LOCATION:                               FACILITY:  [facility]  PHYSICIAN:  Marlowe Kays, M.D.  DATE OF BIRTH:  1926-12-09  DATE OF PROCEDURE:  11/27/2011 DATE OF DISCHARGE:  11/27/2011                              OPERATIVE REPORT   PREOPERATIVE DIAGNOSES: 1. Carpal tunnel syndrome. 2. Ulnar nerve compression elbow, right upper extremity.  POSTOPERATIVE DIAGNOSES: 1. Carpal tunnel syndrome. 2. Ulnar nerve compression elbow, right upper extremity.  OPERATION: 1. Decompression of median nerve, right wrist and hand. 2. Decompression of ulnar nerve, right elbow.  SURGEON:  Marlowe Kays, M.D.  ASSISTANT:  Nurse.  ANESTHESIA:  General pathology.  JUSTIFICATION FOR PROCEDURE:  Pain and tingling in all digits of his right hand with nerve conduction studies demonstrating ulnar neuropathy at the right wrist and demonstrating median nerve, median neuropathy at the right wrist and ulnar neuropathy at the right elbow.  Accordingly, he is here for the above-mentioned surgery.  PROCEDURE:  Prophylactic antibiotics due to total hip replacement, satisfactory general anesthesia, pneumatic tourniquet with the right arm Esmarch out non-sterilely and tourniquet inflated to 250 mmHg.  I then prepped the hand and forearm with DuraPrep, and then holding the hand, we prepped the rest of the arm up to the tourniquet and draped the arm into sterile field.  Time-out performed.  I first marked out a carpal tunnel incision along the base of thenar eminence crossing obliquely over the flexor crease of the wrist and the distal forearm.  Palmaris longus tendon was identified at the wrist and retracted to the radial side, the median nerve was identified.  I then began decompressing it, releasing the skin and subcutaneous tissue and fascia into distal palm. He had a tight compression in the carpal canal.   Potential bleeders were coagulated with bipolar cautery.  When the decompression had been completed, I irrigated the wound well with sterile saline.  Closed the skin and subcutaneous tissue with interrupted 4-0 nylon mattress sutures.  I then went to the elbow and marked out curved incision basically along the course of the ulnar nerve which was identified proximal to the elbow joint and looked relatively normal at this location.  Potential bleeders were coagulated with bipolar cautery.  I then began releasing the nerve distally.  At about the level of the curve before we entered the forearm, the nerve was discolored and thinned out due to compression which I released.  I then checked and the nerve, was not unstable and that it stayed in the groove on flexion and extension of the elbow.  Also distally, it did not appear to be compressed as I went through the two heads of the flexor ulnaris muscle. I irrigated the wound well with sterile saline.  The tourniquet was released.  There was no unusual bleeding.  The subcutaneous tissue was closed with several 2-0 Vicryl, skin with interrupted 4-0 nylon mattress sutures.  Betadine, Adaptic, dry sterile dressing were then applied to the forearm and a well-padded soft dressing to the elbow and then placed a volar plaster splint for the wrist and forearm.  The patient tolerated the procedure well and was taken to the recovery room in satisfactory condition with no known complications.          ______________________________ Marlowe Kays, M.D.     JA/MEDQ  D:  11/27/2011  T:  11/28/2011  Job:  [id number]

## 2011-11-29 NOTE — Anesthesia Postprocedure Evaluation (Signed)
  Anesthesia Post-op Note  Patient: Shane Prince  Procedure(s) Performed: Procedure(s) (LRB): CARPAL TUNNEL RELEASE (Right) ULNAR NERVE DECOMPRESSION/TRANSPOSITION (Right)  Patient Location: PACU  Anesthesia Type: General  Level of Consciousness: awake and alert   Airway and Oxygen Therapy: Patient Spontanous Breathing  Post-op Pain: mild  Post-op Assessment: Post-op Vital signs reviewed, Patient's Cardiovascular Status Stable, Respiratory Function Stable, Patent Airway and No signs of Nausea or vomiting  Post-op Vital Signs: stable  Complications: No apparent anesthesia complications

## 2011-12-03 ENCOUNTER — Encounter (HOSPITAL_BASED_OUTPATIENT_CLINIC_OR_DEPARTMENT_OTHER): Payer: Self-pay | Admitting: Orthopedic Surgery

## 2012-05-28 DIAGNOSIS — L570 Actinic keratosis: Secondary | ICD-10-CM

## 2012-05-28 HISTORY — DX: Actinic keratosis: L57.0

## 2013-05-27 ENCOUNTER — Other Ambulatory Visit (HOSPITAL_COMMUNITY): Payer: Self-pay | Admitting: *Deleted

## 2013-05-27 ENCOUNTER — Other Ambulatory Visit: Payer: Self-pay | Admitting: Orthopedic Surgery

## 2013-05-29 ENCOUNTER — Encounter (HOSPITAL_COMMUNITY): Payer: Self-pay | Admitting: *Deleted

## 2013-05-29 ENCOUNTER — Encounter (HOSPITAL_COMMUNITY): Payer: Self-pay | Admitting: Pharmacy Technician

## 2013-05-31 MED ORDER — TRANEXAMIC ACID 100 MG/ML IV SOLN
1000.0000 mg | INTRAVENOUS | Status: AC
  Administered 2013-06-01: 1000 mg via INTRAVENOUS
  Filled 2013-05-31: qty 10

## 2013-05-31 MED ORDER — CEFAZOLIN SODIUM-DEXTROSE 2-3 GM-% IV SOLR
2.0000 g | INTRAVENOUS | Status: AC
  Administered 2013-06-01: 2 g via INTRAVENOUS
  Filled 2013-05-31 (×2): qty 50

## 2013-05-31 NOTE — H&P (Signed)
TOTAL HIP ADMISSION H&P  Patient is admitted for left total hip arthroplasty.  Subjective:  Chief Complaint: left hip pain  HPI: Shane Prince, 80 y.o. male, has a history of pain and functional disability in the left hip(s) due to arthritis and patient has failed non-surgical conservative treatments for greater than 12 weeks to include NSAID's and/or analgesics, use of assistive devices and activity modification.  Onset of symptoms was gradual starting several years ago with gradually worsening course since that time.The patient noted no past surgery on the left hip(s).  Patient currently rates pain in the left hip at 10 out of 10 with activity. Patient has night pain, worsening of pain with activity and weight bearing, pain that interfers with activities of daily living and pain with passive range of motion. Patient has evidence of subchondral cysts by imaging studies. This condition presents safety issues increasing the risk of falls.  There is no current active infection.  There are no active problems to display for this patient.  Past Medical History  Diagnosis Date  . Hypertension     denies medication use  . HOH (hard of hearing) left    doesn't use hearing aid  . Complication of anesthesia     blister on eye 2 times after surgery.  . Arthritis     Past Surgical History  Procedure Laterality Date  . Joint replacement  2009    rt total hip  . Eye surgery  2008    lt cataract extraction  . Back surgery  2007    L3-4,5  . Carpal tunnel  2012    left  . Knee arthroscopy  1960    rt   . Carpal tunnel release  11/27/2011    Procedure: CARPAL TUNNEL RELEASE;  Surgeon: Drucilla SchmidtJames P Aplington, MD;  [facility] [place];  Service: Orthopedics;  Laterality: Right;  . Ulnar nerve transposition  11/27/2011    Procedure: ULNAR NERVE DECOMPRESSION/TRANSPOSITION;  Surgeon: Drucilla SchmidtJames P Aplington, MD;  Location: [facility];  Service: Orthopedics;  Laterality: Right;   RIGHT ELBOW DECOMPRESSION OF THE ULNAR NERVE  . Bone spur      toe    No prescriptions prior to admission   No Known Allergies  History  Substance Use Topics  . Smoking status: Never Smoker   . Smokeless tobacco: Not on file  . Alcohol Use: 4.2 oz/week    7 Shots of liquor per week    History reviewed. No pertinent family history.   Review of Systems  Constitutional: Negative.   HENT: Negative.   Eyes: Negative.   Respiratory: Negative.   Cardiovascular: Negative.   Gastrointestinal: Negative.   Genitourinary: Negative.   Musculoskeletal: Positive for joint pain.  Skin: Negative.   Neurological: Negative.   Endo/Heme/Allergies: Negative.   Psychiatric/Behavioral: Negative.     Objective:  Physical Exam  Constitutional: He is oriented to person, place, and time. He appears well-developed and well-nourished.  HENT:  Head: Normocephalic and atraumatic.  Eyes: Pupils are equal, round, and reactive to light.  Neck: Neck supple.  Cardiovascular: Intact distal pulses.   Respiratory: Effort normal.  Musculoskeletal:  Flexion on the left side is limited to 80.  Any attempts at internal rotation of the left hip causes severe pain. Foot tap is negative.  Minimally tender over the greater trochanter on the left 2+ tender over the greater trochanter on the right.  Normal pulses to the foot.  Neurological: He is alert and oriented to  person, place, and time.  Skin: Skin is warm and dry.  Psychiatric: He has a normal mood and affect. His behavior is normal. Judgment and thought content normal.    Vital signs in last 24 hours:    Labs:   Estimated body mass index is 31.19 kg/(m^2) as calculated from the following:   Height as of 11/20/11: 6' (1.829 m).   Weight as of 11/27/11: 104.327 kg (230 lb).   Imaging Review Radiographs:  X-rays were ordered, performed, and interpreted by me today included; AP pelvis and crosstable lateral of both hips shows well-placed well fixed,  uncemented Stryker total hip on the right, and end-stage arthritis of the left hip.  There are bone-on-bone arthritic changes with subchondral cysts on the left.  Assessment/Plan:  End stage arthritis, left hip(s)  The patient history, physical examination, clinical judgement of the provider and imaging studies are consistent with end stage degenerative joint disease of the left hip(s) and total hip arthroplasty is deemed medically necessary. The treatment options including medical management, injection therapy, arthroscopy and arthroplasty were discussed at length. The risks and benefits of total hip arthroplasty were presented and reviewed. The risks due to aseptic loosening, infection, stiffness, dislocation/subluxation,  thromboembolic complications and other imponderables were discussed.  The patient acknowledged the explanation, agreed to proceed with the plan and consent was signed. Patient is being admitted for inpatient treatment for surgery, pain control, PT, OT, prophylactic antibiotics, VTE prophylaxis, progressive ambulation and ADL's and discharge planning.The patient is planning to be discharged to skilled nursing facility

## 2013-06-01 ENCOUNTER — Inpatient Hospital Stay (HOSPITAL_COMMUNITY)
Admission: RE | Admit: 2013-06-01 | Discharge: 2013-06-04 | DRG: 470 | Disposition: A | Discharge status: Home Health | Payer: Medicare PPO | Source: Ambulatory Visit | Attending: Orthopedic Surgery | Admitting: Orthopedic Surgery

## 2013-06-01 ENCOUNTER — Inpatient Hospital Stay (HOSPITAL_COMMUNITY): Payer: Medicare PPO | Admitting: Anesthesiology

## 2013-06-01 ENCOUNTER — Encounter (HOSPITAL_COMMUNITY): Payer: Medicare PPO | Admitting: Anesthesiology

## 2013-06-01 ENCOUNTER — Inpatient Hospital Stay (HOSPITAL_COMMUNITY): Payer: Medicare PPO

## 2013-06-01 ENCOUNTER — Encounter (HOSPITAL_COMMUNITY)
Admission: RE | Disposition: A | Discharge status: Home Health | Payer: Self-pay | Source: Ambulatory Visit | Attending: Orthopedic Surgery

## 2013-06-01 ENCOUNTER — Encounter (HOSPITAL_COMMUNITY): Payer: Self-pay | Admitting: Anesthesiology

## 2013-06-01 DIAGNOSIS — Z9849 Cataract extraction status, unspecified eye: Secondary | ICD-10-CM

## 2013-06-01 DIAGNOSIS — Z96649 Presence of unspecified artificial hip joint: Secondary | ICD-10-CM

## 2013-06-01 DIAGNOSIS — M169 Osteoarthritis of hip, unspecified: Principal | ICD-10-CM | POA: Diagnosis present

## 2013-06-01 DIAGNOSIS — M1610 Unilateral primary osteoarthritis, unspecified hip: Principal | ICD-10-CM | POA: Diagnosis present

## 2013-06-01 DIAGNOSIS — I10 Essential (primary) hypertension: Secondary | ICD-10-CM | POA: Diagnosis present

## 2013-06-01 DIAGNOSIS — Z7982 Long term (current) use of aspirin: Secondary | ICD-10-CM

## 2013-06-01 DIAGNOSIS — Z79899 Other long term (current) drug therapy: Secondary | ICD-10-CM

## 2013-06-01 DIAGNOSIS — H9190 Unspecified hearing loss, unspecified ear: Secondary | ICD-10-CM | POA: Diagnosis present

## 2013-06-01 HISTORY — DX: Unspecified osteoarthritis, unspecified site: M19.90

## 2013-06-01 HISTORY — PX: TOTAL HIP ARTHROPLASTY: SHX124

## 2013-06-01 LAB — CBC WITH DIFFERENTIAL/PLATELET
BASOS PCT: 0 % (ref 0–1)
Basophils Absolute: 0 10*3/uL (ref 0.0–0.1)
Eosinophils Absolute: 0.1 10*3/uL (ref 0.0–0.7)
Eosinophils Relative: 1 % (ref 0–5)
HCT: 40 % (ref 39.0–52.0)
HEMOGLOBIN: 13.9 g/dL (ref 13.0–17.0)
LYMPHS ABS: 1.5 10*3/uL (ref 0.7–4.0)
Lymphocytes Relative: 21 % (ref 12–46)
MCH: 29.3 pg (ref 26.0–34.0)
MCHC: 34.8 g/dL (ref 30.0–36.0)
MCV: 84.4 fL (ref 78.0–100.0)
MONOS PCT: 8 % (ref 3–12)
Monocytes Absolute: 0.6 10*3/uL (ref 0.1–1.0)
NEUTROS ABS: 5.2 10*3/uL (ref 1.7–7.7)
NEUTROS PCT: 70 % (ref 43–77)
Platelets: 182 10*3/uL (ref 150–400)
RBC: 4.74 MIL/uL (ref 4.22–5.81)
RDW: 14.2 % (ref 11.5–15.5)
WBC: 7.4 10*3/uL (ref 4.0–10.5)

## 2013-06-01 LAB — BASIC METABOLIC PANEL
BUN: 16 mg/dL (ref 6–23)
CO2: 21 mEq/L (ref 19–32)
Calcium: 9 mg/dL (ref 8.4–10.5)
Chloride: 106 mEq/L (ref 96–112)
Creatinine, Ser: 0.96 mg/dL (ref 0.50–1.35)
GFR, EST AFRICAN AMERICAN: 86 mL/min — AB (ref >90)
GFR, EST NON AFRICAN AMERICAN: 74 mL/min — AB (ref >90)
Glucose, Bld: 122 mg/dL — ABNORMAL HIGH (ref 70–99)
POTASSIUM: 4.2 meq/L (ref 3.7–5.3)
SODIUM: 140 meq/L (ref 137–147)

## 2013-06-01 LAB — PROTIME-INR
INR: 1.04 (ref 0.00–1.49)
Prothrombin Time: 13.4 seconds (ref 11.6–15.2)

## 2013-06-01 LAB — SURGICAL PCR SCREEN
MRSA, PCR: NEGATIVE
Staphylococcus aureus: NEGATIVE

## 2013-06-01 LAB — TYPE AND SCREEN
ABO/RH(D): O POS
Antibody Screen: NEGATIVE

## 2013-06-01 LAB — APTT: aPTT: 31 seconds (ref 24–37)

## 2013-06-01 LAB — ABO/RH: ABO/RH(D): O POS

## 2013-06-01 SURGERY — ARTHROPLASTY, HIP, TOTAL,POSTERIOR APPROACH
Anesthesia: General | Site: Hip | Laterality: Left

## 2013-06-01 MED ORDER — MIDAZOLAM HCL 2 MG/2ML IJ SOLN: 0.5000 mg | Freq: Once | INTRAMUSCULAR | Status: DC | PRN

## 2013-06-01 MED ORDER — DIPHENHYDRAMINE HCL 12.5 MG/5ML PO ELIX: 12.5000 mg | ORAL_SOLUTION | ORAL | Status: DC | PRN

## 2013-06-01 MED ORDER — ONDANSETRON HCL 4 MG PO TABS: 4.0000 mg | ORAL_TABLET | Freq: Four times a day (QID) | ORAL | Status: DC | PRN

## 2013-06-01 MED ORDER — GLYCOPYRROLATE 0.2 MG/ML IJ SOLN
INTRAMUSCULAR | Status: DC | PRN
  Administered 2013-06-01: .8 mg via INTRAVENOUS

## 2013-06-01 MED ORDER — MAGNESIUM CITRATE PO SOLN
1.0000 | Freq: Once | ORAL | Status: AC | PRN
  Filled 2013-06-01: qty 296

## 2013-06-01 MED ORDER — OXYCODONE HCL 5 MG PO TABS
5.0000 mg | ORAL_TABLET | Freq: Once | ORAL | Status: AC | PRN
  Administered 2013-06-01: 5 mg via ORAL

## 2013-06-01 MED ORDER — ACETAMINOPHEN 325 MG PO TABS: 650.0000 mg | ORAL_TABLET | Freq: Four times a day (QID) | ORAL | Status: DC | PRN

## 2013-06-01 MED ORDER — ARTIFICIAL TEARS OP OINT
TOPICAL_OINTMENT | OPHTHALMIC | Status: DC | PRN
  Administered 2013-06-01: 1 via OPHTHALMIC

## 2013-06-01 MED ORDER — HYDROMORPHONE HCL PF 1 MG/ML IJ SOLN
1.0000 mg | INTRAMUSCULAR | Status: DC | PRN
  Administered 2013-06-02 (×2): 1 mg via INTRAVENOUS
  Filled 2013-06-01 (×2): qty 1

## 2013-06-01 MED ORDER — PHENOL 1.4 % MT LIQD: 1.0000 | OROMUCOSAL | Status: DC | PRN

## 2013-06-01 MED ORDER — DOCUSATE SODIUM 100 MG PO CAPS
100.0000 mg | ORAL_CAPSULE | Freq: Two times a day (BID) | ORAL | Status: DC
  Administered 2013-06-01 – 2013-06-04 (×6): 100 mg via ORAL
  Filled 2013-06-01 (×6): qty 1

## 2013-06-01 MED ORDER — MUPIROCIN 2 % EX OINT
TOPICAL_OINTMENT | CUTANEOUS | Status: AC
  Filled 2013-06-01: qty 22

## 2013-06-01 MED ORDER — PROMETHAZINE HCL 25 MG/ML IJ SOLN: 6.2500 mg | INTRAMUSCULAR | Status: DC | PRN

## 2013-06-01 MED ORDER — MUPIROCIN 2 % EX OINT
TOPICAL_OINTMENT | Freq: Two times a day (BID) | CUTANEOUS | Status: DC
  Administered 2013-06-01: 1 via NASAL

## 2013-06-01 MED ORDER — METOCLOPRAMIDE HCL 5 MG/ML IJ SOLN
5.0000 mg | Freq: Three times a day (TID) | INTRAMUSCULAR | Status: DC | PRN
  Filled 2013-06-01: qty 2

## 2013-06-01 MED ORDER — NEOSTIGMINE METHYLSULFATE 1 MG/ML IJ SOLN
INTRAMUSCULAR | Status: DC | PRN
  Administered 2013-06-01: 5 mg via INTRAVENOUS

## 2013-06-01 MED ORDER — PROPOFOL 10 MG/ML IV BOLUS
INTRAVENOUS | Status: DC | PRN
  Administered 2013-06-01: 50 mg via INTRAVENOUS
  Administered 2013-06-01: 200 mg via INTRAVENOUS

## 2013-06-01 MED ORDER — ASPIRIN EC 325 MG PO TBEC
325.0000 mg | DELAYED_RELEASE_TABLET | Freq: Every day | ORAL | Status: DC
  Administered 2013-06-02 – 2013-06-04 (×3): 325 mg via ORAL
  Filled 2013-06-01 (×4): qty 1

## 2013-06-01 MED ORDER — HYDROMORPHONE HCL PF 1 MG/ML IJ SOLN
INTRAMUSCULAR | Status: AC
  Administered 2013-06-02: 1 mg via INTRAVENOUS
  Filled 2013-06-01: qty 1

## 2013-06-01 MED ORDER — METHOCARBAMOL 500 MG PO TABS
500.0000 mg | ORAL_TABLET | Freq: Four times a day (QID) | ORAL | Status: DC | PRN
  Administered 2013-06-02 – 2013-06-04 (×6): 500 mg via ORAL
  Filled 2013-06-01 (×7): qty 1

## 2013-06-01 MED ORDER — SODIUM CHLORIDE 0.9 % IR SOLN
Status: DC | PRN
  Administered 2013-06-01 (×2): 1000 mL

## 2013-06-01 MED ORDER — CEFUROXIME SODIUM 1.5 G IJ SOLR
INTRAMUSCULAR | Status: DC | PRN
  Administered 2013-06-01: 1.5 g

## 2013-06-01 MED ORDER — DEXTROSE-NACL 5-0.45 % IV SOLN: INTRAVENOUS | Status: DC

## 2013-06-01 MED ORDER — DEXTROSE 5 % IV SOLN
INTRAVENOUS | Status: DC | PRN
  Administered 2013-06-01: 12:00:00 via INTRAVENOUS

## 2013-06-01 MED ORDER — EPHEDRINE SULFATE 50 MG/ML IJ SOLN
INTRAMUSCULAR | Status: DC | PRN
  Administered 2013-06-01 (×2): 10 mg via INTRAVENOUS

## 2013-06-01 MED ORDER — LIDOCAINE HCL (CARDIAC) 20 MG/ML IV SOLN
INTRAVENOUS | Status: DC | PRN
  Administered 2013-06-01: 20 mg via INTRAVENOUS

## 2013-06-01 MED ORDER — BUPIVACAINE-EPINEPHRINE (PF) 0.25% -1:200000 IJ SOLN
INTRAMUSCULAR | Status: AC
  Filled 2013-06-01: qty 30

## 2013-06-01 MED ORDER — MIDAZOLAM HCL 5 MG/5ML IJ SOLN
INTRAMUSCULAR | Status: DC | PRN
  Administered 2013-06-01 (×2): 1 mg via INTRAVENOUS

## 2013-06-01 MED ORDER — HYDROMORPHONE HCL PF 1 MG/ML IJ SOLN
INTRAMUSCULAR | Status: AC
  Filled 2013-06-01: qty 1

## 2013-06-01 MED ORDER — METOCLOPRAMIDE HCL 5 MG PO TABS
5.0000 mg | ORAL_TABLET | Freq: Three times a day (TID) | ORAL | Status: DC | PRN
  Filled 2013-06-01: qty 2

## 2013-06-01 MED ORDER — LACTATED RINGERS IV SOLN
INTRAVENOUS | Status: DC
  Administered 2013-06-01: 11:00:00 via INTRAVENOUS

## 2013-06-01 MED ORDER — SENNOSIDES-DOCUSATE SODIUM 8.6-50 MG PO TABS: 1.0000 | ORAL_TABLET | Freq: Every evening | ORAL | Status: DC | PRN

## 2013-06-01 MED ORDER — CEFUROXIME SODIUM 1.5 G IJ SOLR
INTRAMUSCULAR | Status: AC
  Filled 2013-06-01: qty 1.5

## 2013-06-01 MED ORDER — ACETAMINOPHEN 650 MG RE SUPP: 650.0000 mg | Freq: Four times a day (QID) | RECTAL | Status: DC | PRN

## 2013-06-01 MED ORDER — MEPERIDINE HCL 25 MG/ML IJ SOLN: 6.2500 mg | INTRAMUSCULAR | Status: DC | PRN

## 2013-06-01 MED ORDER — ONDANSETRON HCL 4 MG/2ML IJ SOLN: 4.0000 mg | Freq: Four times a day (QID) | INTRAMUSCULAR | Status: DC | PRN

## 2013-06-01 MED ORDER — METHOCARBAMOL 100 MG/ML IJ SOLN
500.0000 mg | Freq: Four times a day (QID) | INTRAVENOUS | Status: DC | PRN
  Filled 2013-06-01: qty 5

## 2013-06-01 MED ORDER — BUPIVACAINE-EPINEPHRINE PF 0.25-1:200000 % IJ SOLN
INTRAMUSCULAR | Status: DC | PRN
  Administered 2013-06-01: 20 mL

## 2013-06-01 MED ORDER — ROCURONIUM BROMIDE 100 MG/10ML IV SOLN
INTRAVENOUS | Status: DC | PRN
  Administered 2013-06-01: 50 mg via INTRAVENOUS

## 2013-06-01 MED ORDER — BISACODYL 5 MG PO TBEC: 5.0000 mg | DELAYED_RELEASE_TABLET | Freq: Every day | ORAL | Status: DC | PRN

## 2013-06-01 MED ORDER — FENTANYL CITRATE 0.05 MG/ML IJ SOLN
INTRAMUSCULAR | Status: DC | PRN
  Administered 2013-06-01 (×3): 50 ug via INTRAVENOUS
  Administered 2013-06-01: 100 ug via INTRAVENOUS
  Administered 2013-06-01 (×2): 50 ug via INTRAVENOUS

## 2013-06-01 MED ORDER — OXYCODONE HCL 5 MG PO TABS
5.0000 mg | ORAL_TABLET | ORAL | Status: DC | PRN
  Administered 2013-06-01: 5 mg via ORAL
  Administered 2013-06-02 – 2013-06-04 (×10): 10 mg via ORAL
  Filled 2013-06-01 (×2): qty 1
  Filled 2013-06-01: qty 2
  Filled 2013-06-01: qty 1
  Filled 2013-06-01 (×3): qty 2
  Filled 2013-06-01: qty 1
  Filled 2013-06-01 (×5): qty 2

## 2013-06-01 MED ORDER — AMLODIPINE BESYLATE 5 MG PO TABS
5.0000 mg | ORAL_TABLET | Freq: Every day | ORAL | Status: DC
  Administered 2013-06-02 – 2013-06-04 (×3): 5 mg via ORAL
  Filled 2013-06-01 (×3): qty 1

## 2013-06-01 MED ORDER — CHLORHEXIDINE GLUCONATE 4 % EX LIQD: 60.0000 mL | Freq: Once | CUTANEOUS | Status: DC

## 2013-06-01 MED ORDER — OXYCODONE HCL 5 MG PO TABS
ORAL_TABLET | ORAL | Status: AC
  Filled 2013-06-01: qty 1

## 2013-06-01 MED ORDER — MENTHOL 3 MG MT LOZG: 1.0000 | LOZENGE | OROMUCOSAL | Status: DC | PRN

## 2013-06-01 MED ORDER — OXYCODONE HCL 5 MG/5ML PO SOLN: 5.0000 mg | Freq: Once | ORAL | Status: AC | PRN

## 2013-06-01 MED ORDER — HYDROMORPHONE HCL PF 1 MG/ML IJ SOLN
0.2500 mg | INTRAMUSCULAR | Status: DC | PRN
  Administered 2013-06-01: 0.5 mg via INTRAVENOUS
  Administered 2013-06-01: 1 mg via INTRAVENOUS
  Administered 2013-06-01: 0.5 mg via INTRAVENOUS

## 2013-06-01 MED ORDER — KCL IN DEXTROSE-NACL 20-5-0.45 MEQ/L-%-% IV SOLN
INTRAVENOUS | Status: DC
  Administered 2013-06-02 (×2): via INTRAVENOUS
  Filled 2013-06-01 (×11): qty 1000

## 2013-06-01 MED ORDER — LACTATED RINGERS IV SOLN
INTRAVENOUS | Status: DC | PRN
  Administered 2013-06-01 (×3): via INTRAVENOUS

## 2013-06-01 SURGICAL SUPPLY — 58 items
BLADE SAW SGTL 18X1.27X75 (BLADE) ×2 IMPLANT
BRUSH FEMORAL CANAL (MISCELLANEOUS) ×1 IMPLANT
CAPT HIP MOP CEMENTED ×1 IMPLANT
CEMENT BONE DEPUY (Cement) ×2 IMPLANT
CLOTH BEACON ORANGE TIMEOUT ST (SAFETY) ×2 IMPLANT
COVER BACK TABLE 24X17X13 BIG (DRAPES) IMPLANT
COVER SURGICAL LIGHT HANDLE (MISCELLANEOUS) ×3 IMPLANT
DRAPE ORTHO SPLIT 77X108 STRL (DRAPES) ×2
DRAPE PROXIMA HALF (DRAPES) ×2 IMPLANT
DRAPE SURG ORHT 6 SPLT 77X108 (DRAPES) ×1 IMPLANT
DRAPE U-SHAPE 47X51 STRL (DRAPES) ×2 IMPLANT
DRILL BIT 7/64X5 (BIT) ×2 IMPLANT
DRSG AQUACEL AG ADV 3.5X10 (GAUZE/BANDAGES/DRESSINGS) ×2 IMPLANT
DURAPREP 26ML APPLICATOR (WOUND CARE) ×2 IMPLANT
ELECT BLADE 4.0 EZ CLEAN MEGAD (MISCELLANEOUS) ×2
ELECT REM PT RETURN 9FT ADLT (ELECTROSURGICAL) ×2
ELECTRODE BLDE 4.0 EZ CLN MEGD (MISCELLANEOUS) IMPLANT
ELECTRODE REM PT RTRN 9FT ADLT (ELECTROSURGICAL) ×1 IMPLANT
GAUZE XEROFORM 1X8 LF (GAUZE/BANDAGES/DRESSINGS) ×1 IMPLANT
GLOVE BIO SURGEON STRL SZ7 (GLOVE) ×1 IMPLANT
GLOVE BIO SURGEON STRL SZ7.5 (GLOVE) ×3 IMPLANT
GLOVE BIO SURGEON STRL SZ8.5 (GLOVE) ×4 IMPLANT
GLOVE BIOGEL M STRL SZ7.5 (GLOVE) ×1 IMPLANT
GLOVE BIOGEL PI IND STRL 7.0 (GLOVE) IMPLANT
GLOVE BIOGEL PI IND STRL 7.5 (GLOVE) IMPLANT
GLOVE BIOGEL PI IND STRL 8 (GLOVE) ×2 IMPLANT
GLOVE BIOGEL PI IND STRL 9 (GLOVE) ×1 IMPLANT
GLOVE BIOGEL PI INDICATOR 7.0 (GLOVE) ×1
GLOVE BIOGEL PI INDICATOR 7.5 (GLOVE) ×1
GLOVE BIOGEL PI INDICATOR 8 (GLOVE) ×1
GLOVE BIOGEL PI INDICATOR 9 (GLOVE) ×1
GOWN PREVENTION PLUS XLARGE (GOWN DISPOSABLE) ×3 IMPLANT
GOWN STRL NON-REIN LRG LVL3 (GOWN DISPOSABLE) ×2 IMPLANT
GOWN STRL REIN XL XLG (GOWN DISPOSABLE) ×4 IMPLANT
HANDPIECE INTERPULSE COAX TIP (DISPOSABLE) ×2
HOOD PEEL AWAY FACE SHEILD DIS (HOOD) ×4 IMPLANT
KIT BASIN OR (CUSTOM PROCEDURE TRAY) ×2 IMPLANT
KIT ROOM TURNOVER OR (KITS) ×2 IMPLANT
MANIFOLD NEPTUNE II (INSTRUMENTS) ×2 IMPLANT
NEEDLE 22X1 1/2 (OR ONLY) (NEEDLE) ×2 IMPLANT
NS IRRIG 1000ML POUR BTL (IV SOLUTION) ×2 IMPLANT
PACK TOTAL JOINT (CUSTOM PROCEDURE TRAY) ×2 IMPLANT
PAD ARMBOARD 7.5X6 YLW CONV (MISCELLANEOUS) ×4 IMPLANT
PASSER SUT SWANSON 36MM LOOP (INSTRUMENTS) ×2 IMPLANT
PRESSURIZER FEMORAL UNIV (MISCELLANEOUS) ×1 IMPLANT
SET HNDPC FAN SPRY TIP SCT (DISPOSABLE) IMPLANT
SUT ETHIBOND 2 V 37 (SUTURE) ×2 IMPLANT
SUT ETHILON 3 0 FSL (SUTURE) ×2 IMPLANT
SUT VIC AB 0 CTB1 27 (SUTURE) ×2 IMPLANT
SUT VIC AB 1 CTX 36 (SUTURE) ×2
SUT VIC AB 1 CTX36XBRD ANBCTR (SUTURE) ×1 IMPLANT
SUT VIC AB 2-0 CTB1 (SUTURE) ×2 IMPLANT
SYR CONTROL 10ML LL (SYRINGE) ×2 IMPLANT
TOWEL OR 17X24 6PK STRL BLUE (TOWEL DISPOSABLE) ×2 IMPLANT
TOWEL OR 17X26 10 PK STRL BLUE (TOWEL DISPOSABLE) ×2 IMPLANT
TOWER CARTRIDGE SMART MIX (DISPOSABLE) ×1 IMPLANT
TRAY FOLEY CATH 14FR (SET/KITS/TRAYS/PACK) IMPLANT
WATER STERILE IRR 1000ML POUR (IV SOLUTION) ×4 IMPLANT

## 2013-06-01 NOTE — Transfer of Care (Signed)
Immediate Anesthesia Transfer of Care Note  Patient: Shane PennerRobert W Prince  Procedure(s) Performed: Procedure(s): TOTAL HIP ARTHROPLASTY (Left)  Patient Location: PACU  Anesthesia Type:General  Level of Consciousness: awake, alert  and patient cooperative  Airway & Oxygen Therapy: Patient Spontanous Breathing and Patient connected to face mask oxygen  Post-op Assessment: Report given to PACU RN and Post -op Vital signs reviewed and stable  Post vital signs: Reviewed and stable  Complications: No apparent anesthesia complications

## 2013-06-01 NOTE — Anesthesia Procedure Notes (Signed)
Procedure Name: Intubation Date/Time: 06/01/2013 12:09 PM Performed by: Darcey NoraJAMES, [name] Pre-anesthesia Checklist: Patient identified, Emergency Drugs available, Suction available and Patient being monitored Patient Re-evaluated:Patient Re-evaluated prior to inductionOxygen Delivery Method: Circle system utilized Preoxygenation: Pre-oxygenation with 100% oxygen Intubation Type: IV induction Ventilation: Mask ventilation without difficulty Laryngoscope Size: Mac and 4 Grade View: Grade III Tube type: Oral Tube size: 7.5 mm Number of attempts: 2 (KBJ unable to visualize even base of cords; Dr. Jean RosenthalJackson viewed arytenoids and passed ETT) Airway Equipment and Method: Stylet Placement Confirmation: ETT inserted through vocal cords under direct vision,  breath sounds checked- equal and bilateral and positive ETCO2 Secured at: 22 (cm at teeth) cm Tube secured with: Tape Dental Injury: Teeth and Oropharynx as per pre-operative assessment  Difficulty Due To: Difficult Airway- due to reduced neck mobility

## 2013-06-01 NOTE — Preoperative (Signed)
Beta Blockers   Reason not to administer Beta Blockers:Not Applicable 

## 2013-06-01 NOTE — Anesthesia Preprocedure Evaluation (Addendum)
Anesthesia Evaluation  Patient identified by MRN, date of birth, ID band Patient awake    Reviewed: Allergy & Precautions, H&P , NPO status , Patient's Chart, lab work & pertinent test results, reviewed documented beta blocker date and time   History of Anesthesia Complications Negative for: history of anesthetic complications  Airway Mallampati: II TM Distance: >3 FB Neck ROM: Full    Dental  (+) Dental Advisory Given and Teeth Intact   Pulmonary neg pulmonary ROS,  breath sounds clear to auscultation  Pulmonary exam normal       Cardiovascular hypertension, Pt. 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 There is a small chip on upper right incisor.; crowns upper front times 2.  Reproductive/Obstetrics                       Anesthesia Physical Anesthesia Plan  ASA: II  Anesthesia Plan: General   Post-op Pain Management:    Induction: Intravenous  Airway Management Planned: Oral ETT  Additional Equipment:   Intra-op Plan:   Post-operative Plan: Extubation in OR  Informed Consent: I have reviewed the patients History and Physical, chart, labs and discussed the procedure including the risks, benefits and alternatives for the proposed anesthesia with the patient or authorized representative who has indicated his/her understanding and acceptance.   Dental advisory given  Plan Discussed with: CRNA and Surgeon  Anesthesia Plan Comments: (Plan routine monitors, GETA)        Anesthesia Quick Evaluation

## 2013-06-01 NOTE — Anesthesia Postprocedure Evaluation (Signed)
  Anesthesia Post-op Note  Patient: Shane PennerRobert W Prince  Procedure(s) Performed: Procedure(s): TOTAL HIP ARTHROPLASTY (Left)  Patient Location: PACU  Anesthesia Type:General  Level of Consciousness: awake, alert , oriented and patient cooperative  Airway and Oxygen Therapy: Patient Spontanous Breathing and Patient connected to nasal cannula oxygen  Post-op Pain: mild  Post-op Assessment: Post-op Vital signs reviewed, Patient's Cardiovascular Status Stable, Respiratory Function Stable, Patent Airway, No signs of Nausea or vomiting and Pain level controlled  Post-op Vital Signs: Reviewed and stable  Complications: No apparent anesthesia complications

## 2013-06-01 NOTE — Op Note (Signed)
PATIENT ID:      Noelle PennerRobert W Hyun  MRN:     [id number] DOB/AGE:    78-May-1928 / 78 y.o.       OPERATIVE REPORT    DATE OF PROCEDURE:  06/01/2013       PREOPERATIVE DIAGNOSIS:  osteoarthritis left hip                                                       Estimated body mass index is 30.58 kg/(m^2) as calculated from the following:   Height as of this encounter: 6' 1.5" (1.867 m).   Weight as of this encounter: 106.595 kg (235 lb).     POSTOPERATIVE DIAGNOSIS:  osteoarthritis left hip                                                           PROCEDURE:  L total hip arthroplasty using a 60 mm DePuy Pinnacle  Cup, Peabody Energypex Hole Eliminator, 10-degree polyethylene liner index superior  and posterior, a +1.5 36 mm metal head, a #7 Summit basic stem, cemented double batch of DePuy HV cement with 1500 mg of Zinacef, 13 mm centralizer and #4 central canal occluder   SURGEON: Rowdy Guerrini J    ASSISTANT:   Eric K. Gaylene BrooksPhillips PA-C  (present throughout entire procedure and necessary for timely completion of the procedure)  ANESTHESIA:  Anesthesia type not filed in the log.  BLOOD LOSS: * No blood loss amount entered *  FLUID REPLACEMENT: 2000 crystalloid DRAINS:  URINE OUTPUT:  COMPLICATIONS:  None    INDICATIONS FOR PROCEDURE:Patient with end-stage arthritis of the L hip.  X-rays show bone-on-bone arthritic changes. Despite conservative measures with observation, anti-inflammatory medicine, narcotics, use of a cane, has severe unremitting pain and can ambulate only 1 blocks before resting.  Patient desires elective right total hip arthroplasty to decrease pain and increase function. The risks, benefits, and alternatives were discussed at length including but not limited to the risks of infection, bleeding, nerve injury, stiffness, blood clots, the need for revision surgery, cardiopulmonary complications, among others, and they were willing to proceed.Benefits have been discussed. Questions answered.      PROCEDURE IN DETAIL: The patient was identified by armband,  received preoperative IV antibiotics in the holding area at [facility], taken to the operating room , appropriate anesthetic monitors  were attached and general endotracheal anesthesia induced. Foley catheter was inserted. Patient was rolled into the R lateral decubitus position and fixed there with a Stulberg Mark II pelvic clamp and the L lower extremity was then prepped and draped  in the usual sterile fashion from the ankle to the hemipelvis. A time-out  procedure was performed. The skin along the lateral hip and thigh  infiltrated with 10 mL of 0.5% Marcaine and epinephrine solution. We  then made a posterolateral approach to the hip. With a #10 blade, 20 cm  incision through skin and subcutaneous tissue down to the level of the  IT band. Small bleeders were identified and cauterized. IT band cut in  line with skin incision exposing the greater trochanter. A Cobra retractor was placed between the  gluteus minimus and the superior hip joint capsule, and a spiked Cobra between the quadratus femoris and the inferior hip joint capsule. This isolated the short  external rotators and piriformis tendons. These were tagged with a #2 Ethibond  suture and cut off their insertion on the intertrochanteric crest. The posterior  capsule was then developed into an acetabular-based flap from Posterior Superior off of the acetabulum out over the femoral neck and back posterior inferior to the acetabular rim. This flap was tagged with two #2 Ethibond sutures and retracted protecting the sciatic nerve. This exposed the arthritic femoral head and osteophytes. The hip was then flexed and internally rotated, dislocating the femoral head and a standard neck cut performed 1 fingerbreadth above the lesser trochanter.  A spiked Cobra was placed in the cotyloid notch and a Hohmann retractor was then used to lever the femur anteriorly off of the anterior  pelvic column. A posterior-inferior wing retractor was placed at the junction of the acetabulum and the ischium completing the acetabular exposure.We then removed the peripheral osteophytes and labrum from the acetabulum. We then reamed the acetabulum up to 59 mm with basket reamers obtaining good coverage in all quadrants, irrigated out with normal  saline solution and hammered into place a 60 mm pinnacle cup in 45  degrees of abduction and about 20 degrees of anteversion. More  peripheral osteophytes removed and a trial 10-degree liner placed with the  index superior-posterior. The hip was then flexed and internally rotated exposing the  proximal femur, which was entered with the initiating reamer followed by  the tapered reamers up to a 7 tapered reamer and broaching up to a #7 broach, which had good fit and fill. A trial reduction was then  performed with a +1.5  36-mm ball on the standard neck and  excellent stability was noted with at 90 of flexion with 60 of  internal rotation and then full extension withexternal rotation. The hip  could not be dislocated in full extension. The knee could easily flex  to about 140 degrees. We also stretched the abductors at this point,  because of the preexisting adductor contractures. All trial components  were then removed. The acetabulum was irrigated out with normal saline  solution. A titanium [facility] was then screwed into place  followed by a 10-degree polyethylene liner index superior-posterior. On  the femoral side, we then sized for a #4 cement restrictor and irrigated  out the femoral canal with normal saline solution and dried with suction  and sponges. The cement restrictor was placed to the appropriate depth. A  double batch of DePuy 1 cement with 1500 mg of Zinacef was mixed and  injected into the canal under pressure followed by #7 Summit stem  in 20 degrees of anteversion and as this was held in place, excess cement  was  removed. At this point, a +0 36-mm metal head was  hammered on the stem. The hip was reduced. We checked our stability  one more time and found to be excellent. The wound was once again  thoroughly irrigated out with normal saline solution pulse lavage. The  capsular flap and short external rotators were repaired back to the  intertrochanteric crest through drill holes with a #2 Ethibond suture.  The IT band was closed with running 1 Vicryl suture. The subcutaneous  tissue with 0 and 2-0 undyed Vicryl suture and the skin with running  interlocking 3-0 nylon suture. Dressing of Xeroform and Mepilex was  then applied. The patient was then unclamped, rolled supine, awaken extubated and taken to recovery room without difficulty in stable condition.   Nneoma Harral J 06/01/2013, 1:34 PM

## 2013-06-01 NOTE — Interval H&P Note (Signed)
History and Physical Interval Note:  06/01/2013 9:50 AM  Shane Prince  has presented today for surgery, with the diagnosis of osteoarthritis left hip  The various methods of treatment have been discussed with the patient and family. After consideration of risks, benefits and other options for treatment, the patient has consented to  Procedure(s): TOTAL HIP ARTHROPLASTY (Left) as a surgical intervention .  The patient's history has been reviewed, patient examined, no change in status, stable for surgery.  I have reviewed the patient's chart and labs.  Questions were answered to the patient's satisfaction.     Nestor LewandowskyOWAN,Laureano Hetzer J

## 2013-06-01 NOTE — OR Nursing (Signed)
Off monitor/awaiting room assignment/ spoke with family at bedside in pt's [resence re: ongoing bed delays, appreciate "the honesty" staes daughter/ offered meal to pt, elects to wait./ assessed for pain, "yeh it hurts" per tp , medicated accordingly

## 2013-06-01 NOTE — OR Nursing (Signed)
Care assumed

## 2013-06-01 NOTE — OR Nursing (Signed)
Spoke again, in person, with daughters re current anticipated placement of pt on 4N. Reassured of care/therapies

## 2013-06-02 LAB — BASIC METABOLIC PANEL
BUN: 13 mg/dL (ref 6–23)
CHLORIDE: 100 meq/L (ref 96–112)
CO2: 26 mEq/L (ref 19–32)
Calcium: 8.4 mg/dL (ref 8.4–10.5)
Creatinine, Ser: 1.01 mg/dL (ref 0.50–1.35)
GFR calc Af Amer: 77 mL/min — ABNORMAL LOW (ref >90)
GFR calc non Af Amer: 66 mL/min — ABNORMAL LOW (ref >90)
GLUCOSE: 139 mg/dL — AB (ref 70–99)
POTASSIUM: 4.3 meq/L (ref 3.7–5.3)
Sodium: 137 mEq/L (ref 137–147)

## 2013-06-02 LAB — CBC
HEMATOCRIT: 34.3 % — AB (ref 39.0–52.0)
HEMOGLOBIN: 11.5 g/dL — AB (ref 13.0–17.0)
MCH: 29.2 pg (ref 26.0–34.0)
MCHC: 33.5 g/dL (ref 30.0–36.0)
MCV: 87.1 fL (ref 78.0–100.0)
Platelets: 156 10*3/uL (ref 150–400)
RBC: 3.94 MIL/uL — ABNORMAL LOW (ref 4.22–5.81)
RDW: 14.7 % (ref 11.5–15.5)
WBC: 10.1 10*3/uL (ref 4.0–10.5)

## 2013-06-02 MED ORDER — WHITE PETROLATUM GEL
Status: AC
  Administered 2013-06-02: 0.2
  Filled 2013-06-02: qty 5

## 2013-06-02 MED ORDER — ACYCLOVIR 200 MG PO CAPS
200.0000 mg | ORAL_CAPSULE | Freq: Every day | ORAL | Status: DC
  Administered 2013-06-02 – 2013-06-04 (×10): 200 mg via ORAL
  Filled 2013-06-02 (×14): qty 1

## 2013-06-02 NOTE — Care Management Note (Signed)
    Page 1 of 2   06/04/2013     9:45:58 AM   CARE MANAGEMENT NOTE 06/04/2013  Patient:  Shane Prince, Shane Prince   Account Number:  [id number]  Date Initiated:  06/02/2013  Documentation initiated by:  Lorne Skeens  Subjective/Objective Assessment:   Patient admitted for Left hip osteoarthritis. Lives at home alone     Action/Plan:   will follow for discharge needs.   Anticipated DC Date:     Anticipated DC Plan:  [place]  CM consult      Choice offered to / List presented to:  C-4 Adult Children        HH arranged  HH-1 RN  [place].   Status of service:  Completed, signed off Medicare Important Message given?   (If response is "NO", the following Medicare IM given date fields will be blank) Date Medicare IM given:   Date Additional Medicare IM given:    Discharge Disposition:  [place]  Per UR Regulation:  Reviewed for med. necessity/level of care/duration of stay  If discussed at [place] of Stay Meetings, dates discussed:    Comments:  06/04/13 [place] RN, MSN, CM- CM notified Stanton Kidney with [facility] of discharge orders for today.  Met with patient and family to assure them that home health is set-up for discharge today.   06/02/13 [place] [employer], MSN, CM- Met with patient and family to discuss home health needs.  Patient's family has chosen [facility] at discharge. Mary with [facility] was notified of referral. CM left a note in the chart for the physician requesting an order for CPM at discharge, if desired.  Patient has a walker and raised toilet seat already at home.  He is declining a 3n1 at this time.  CM will continue to follow.

## 2013-06-02 NOTE — Evaluation (Signed)
Occupational Therapy Evaluation Patient Details Name: Noelle PennerRobert W Bhalla MRN: [id number] DOB: 1926/06/09 Today's Date: 06/02/2013 Time: [id number] OT Time Calculation (min): 23 min  OT Assessment / Plan / Recommendation History of present illness pt presents with L THA.     Clinical Impression   Pt presents with below problem list. Pt independent with ADLs, PTA. Feel pt could benefit from acute OT to increase independence prior to d/c.    OT Assessment  Patient needs continued OT Services    Follow Up Recommendations  Home health OT;Supervision/Assistance - 24 hour    Barriers to Discharge      Equipment Recommendations  None recommended by OT    Recommendations for Other Services    Frequency  Min 2X/week    Precautions / Restrictions Precautions Precautions: Fall;Posterior Hip Precaution Booklet Issued: No Precaution Comments: Reviewed precautions with pt Restrictions Weight Bearing Restrictions: Yes LLE Weight Bearing: Weight bearing as tolerated   Pertinent Vitals/Pain Pain 7/10 in left hip. Increased activity during session.     ADL  Eating/Feeding: Independent Where Assessed - Eating/Feeding: Chair Grooming: Set up Where Assessed - Grooming: Supported sitting Upper Body Bathing: Set up;Supervision/safety Where Assessed - Upper Body Bathing: Supported sitting Lower Body Bathing: Moderate assistance Where Assessed - Lower Body Bathing: Supported sit to stand Upper Body Dressing: Set up;Supervision/safety Where Assessed - Upper Body Dressing: Supported sitting Lower Body Dressing: Moderate assistance Where Assessed - Lower Body Dressing: Supported sit to Pharmacist, hospitalstand Toilet Transfer: Moderate assistance Toilet Transfer Method: Sit to stand;Stand pivot Toilet Transfer Equipment: Other (comment) (recliner chair <> BSC) Toileting - Clothing Manipulation and Hygiene: Min guard Where Assessed - Toileting Clothing Manipulation and Hygiene: Sit to stand from 3-in-1 or  toilet Tub/Shower Transfer Method: Not assessed Equipment Used: Gait belt;Rolling walker;Reacher;Long-handled sponge;Long-handled shoe horn;Sock aid Transfers/Ambulation Related to ADLs: Min/Mod A for transfers. ADL Comments: Pt practiced with sockaid and reacher. Educated on AE for LB ADLs.     OT Diagnosis: Acute pain  OT Problem List: Decreased strength;Decreased range of motion;Decreased activity tolerance;Impaired balance (sitting and/or standing);Decreased knowledge of use of DME or AE;Decreased knowledge of precautions;Pain OT Treatment Interventions: Self-care/ADL training;DME and/or AE instruction;Therapeutic activities;Patient/family education;Balance training   OT Goals(Current goals can be found in the care plan section) Acute Rehab OT Goals Patient Stated Goal: Home OT Goal Formulation: With patient Time For Goal Achievement: 06/09/13 Potential to Achieve Goals: Good ADL Goals Pt Will Perform Lower Body Bathing: with supervision;with adaptive equipment;sit to/from stand Pt Will Perform Lower Body Dressing: with adaptive equipment;sit to/from stand;with supervision Pt Will Transfer to Toilet: with modified independence;ambulating;grab bars (comfort height toilet ) Pt Will Perform Toileting - Clothing Manipulation and hygiene: with modified independence;sit to/from stand Pt Will Perform Tub/Shower Transfer: Tub transfer;with supervision;tub bench;rolling walker Additional ADL Goal #1: Pt will independently verbalize and demonstrate 3/3 hip precautions.    Visit Information  Last OT Received On: 06/02/13 Assistance Needed: +2-for ambulation History of Present Illness: pt presents with L THA.         Prior Functioning     Home Living Family/patient expects to be discharged to:: Private residence Living Arrangements: Alone Available Help at Discharge: Family;Available 24 hours/day Type of Home: House Home Access: Stairs to enter Enter[employer] CorporationEntrance Stairs-Number of Steps:  2 Entrance Stairs-Rails: Right;Left;Can reach both Home Layout: One level Home Equipment: Walker - 2 wheels;Cane - single point;Tub bench;Toilet riser;Grab bars - tub/shower;Grab bars - toilet;Adaptive equipment Adaptive Equipment: Reacher;Long-handled shoe horn;Long-handled sponge Prior Function Level of Independence: Independent Communication  Communication: No difficulties         Vision/Perception     Cognition  Cognition Arousal/Alertness: Awake/alert Behavior During Therapy: WFL for tasks assessed/performed Overall Cognitive Status: Within Functional Limits for tasks assessed    Extremity/Trunk Assessment Upper Extremity Assessment Upper Extremity Assessment: Overall WFL for tasks assessed Lower Extremity Assessment Lower Extremity Assessment: Defer to PT evaluation LLE Deficits / Details: ROM and strength limited by pain.   LLE: Unable to fully assess due to pain Cervical / Trunk Assessment Cervical / Trunk Assessment: Normal     Mobility Bed Mobility Bed Mobility: Not assessed Transfers Transfers: Sit to Stand;Stand to Sit Sit to Stand: 3: Mod assist;4: Min assist;With upper extremity assist;From chair/3-in-1 Stand to Sit: 4: Min assist;3: Mod assist;With upper extremity assist;To chair/3-in-1 Details for Transfer Assistance: Mod A for stand pivot transfer. Min A for sit <> stand to/from [facility]. Mod A for sit <> stand from recliner chair.            End of Session OT - End of Session Equipment Utilized During Treatment: Gait belt;Rolling walker Activity Tolerance: Patient tolerated treatment well Patient left: in chair;with call bell/phone within reach;with chair alarm set;with family/visitor present  Lorri Frederick  OTR/L [phone]  06/02/2013, 11:20 AM

## 2013-06-02 NOTE — Evaluation (Signed)
Physical Therapy Evaluation Patient Details Name: Shane PennerRobert W Prince MRN: [id number] DOB: May 08, 1927 Today's Date: 06/02/2013 Time: [id number] PT Time Calculation (min): 46 min  PT Assessment / Plan / Recommendation History of Present Illness  pt presents with L THA.    Clinical Impression  Pt requiring extensive A for mobility at this time due to pain.  Pt and RN ed on Hip Precautions and WBing status.  Pt wanting to D/C to home, however pt's daughter spoke to PT outside of the room stating that family has already discussed with pt potential need for SNF prior to return home pending pt's progress and safety.  At this time feel SNF is safest D/C option, however pt's goal is for home.      PT Assessment  Patient needs continued PT services    Follow Up Recommendations  Home health PT;Supervision/Assistance - 24 hour    Does the patient have the potential to tolerate intense rehabilitation      Barriers to Discharge        Equipment Recommendations  None recommended by PT    Recommendations for Other Services     Frequency 7X/week    Precautions / Restrictions Precautions Precautions: Fall;Posterior Hip Precaution Booklet Issued: Yes (comment) Precaution Comments: Reviewed precautions with pt and RN.   Restrictions Weight Bearing Restrictions: Yes LLE Weight Bearing: Weight bearing as tolerated   Pertinent Vitals/Pain 12/10 L hip.  RN medicated.        Mobility  Bed Mobility Bed Mobility: Supine to Sit;Sitting - Scoot to Edge of Bed Supine to Sit: 3: Mod assist;HOB elevated;With rails Sitting - Scoot to Edge of Bed: 3: Mod assist Details for Bed Mobility Assistance: pt needs step-by-step cueing for sequencing, hip precautions.  pt needed multiple rest breaks due to pain while trying to come to sit.   Transfers Transfers: Sit to Stand;Stand to Sit;Stand Pivot Transfers Sit to Stand: 1: +2 Total assist;With upper extremity assist;From bed Sit to Stand: Patient Percentage:  50% Stand to Sit: 1: +2 Total assist;With upper extremity assist;To chair/3-in-1 Stand to Sit: Patient Percentage: 60% Stand Pivot Transfers: 1: +2 Total assist Stand Pivot Transfers: Patient Percentage: 70% Details for Transfer Assistance: cues for UE use, positioning of LEs, controlling descent to chair.   Ambulation/Gait Ambulation/Gait Assistance: Not tested (comment) Stairs: No Wheelchair Mobility Wheelchair Mobility: No    Exercises Total Joint Exercises Ankle Circles/Pumps: AROM;Both;10 reps Quad Sets: AROM;Both;10 reps   PT Diagnosis: Abnormality of gait;Acute pain  PT Problem List: Decreased strength;Decreased range of motion;Decreased activity tolerance;Decreased balance;Decreased mobility;Decreased knowledge of use of DME;Decreased knowledge of precautions;Pain PT Treatment Interventions: Gait training;DME instruction;Stair training;Functional mobility training;Therapeutic activities;Therapeutic exercise;Balance training;Patient/family education     PT Goals(Current goals can be found in the care plan section) Acute Rehab PT Goals Patient Stated Goal: Home PT Goal Formulation: With patient Time For Goal Achievement: 06/09/13 Potential to Achieve Goals: Good  Visit Information  Last PT Received On: 06/02/13 Assistance Needed: +2 History of Present Illness: pt presents with L THA.         Prior Functioning  Home Living Family/patient expects to be discharged to:: Private residence Living Arrangements: Alone Available Help at Discharge: Family;Available 24 hours/day Type of Home: House Home Access: Stairs to enter [employer] CorporationEntrance Stairs-Number of Steps: 2 Entrance Stairs-Rails: Right;Left;Can reach both Home Layout: One level Home Equipment: Walker - 2 wheels;Cane - single point;Tub bench;Toilet riser Prior Function Level of Independence: Independent Communication Communication: HOH    Cognition  Cognition Arousal/Alertness: Awake/alert  Behavior During Therapy:  WFL for tasks assessed/performed Overall Cognitive Status: Within Functional Limits for tasks assessed    Extremity/Trunk Assessment Upper Extremity Assessment Upper Extremity Assessment: Defer to OT evaluation Lower Extremity Assessment Lower Extremity Assessment: LLE deficits/detail LLE Deficits / Details: ROM and strength limited by pain.   LLE: Unable to fully assess due to pain Cervical / Trunk Assessment Cervical / Trunk Assessment: Normal   Balance Balance Balance Assessed: No  End of Session PT - End of Session Equipment Utilized During Treatment: Gait belt Activity Tolerance: Patient limited by pain Patient left: in chair;with call bell/phone within reach;with chair alarm set;with family/visitor present Nurse Communication: Mobility status;Precautions;Weight bearing status;Patient requests pain meds  GP     Sunny Schlein, [place] [phone] 06/02/2013, 9:17 AM

## 2013-06-02 NOTE — Progress Notes (Signed)
Patient ID: Noelle PennerRobert W Graley, male   DOB: 1926-07-02, 80 y.o.   MRN: [id number] PATIENT ID: Noelle PennerRobert W Mika  MRN: [id number]  DOB/AGE:  1926-07-02 / 80 y.o.  1 Day Post-Op Procedure(s) (LRB): TOTAL HIP ARTHROPLASTY (Left)    PROGRESS NOTE Subjective: Patient is alert, oriented,no Nausea, no Vomiting, yes passing gas, no Bowel Movement. Taking PO well. Denies SOB, Chest or Calf Pain. Using Incentive Spirometer, PAS in place. Ambulate WBAT, post precautions Patient reports pain as 3 on 0-10 scale  .    Objective: Vital signs in last 24 hours: Filed Vitals:   06/02/13 0000 06/02/13 0138 06/02/13 0355 06/02/13 0530  BP:  143/76  130/76  Pulse:  83  91  Temp:  97.4 F (36.3 C)  98.8 F (37.1 C)  TempSrc:  Oral  Oral  Resp: 20 20 20 20   Height:      Weight:      SpO2: 98% 93%  93%      Intake/Output from previous day: I/O last 3 completed shifts: In: 2450 [I.V.:2450] Out: 950 [Urine:700; Blood:250]   Intake/Output this shift:     LABORATORY DATA:  Recent Labs  06/01/13 1000 06/02/13 0620  WBC 7.4 10.1  HGB 13.9 11.5*  HCT 40.0 34.3*  PLT 182 156  NA 140 137  K 4.2 4.3  CL 106 100  CO2 21 26  BUN 16 13  CREATININE 0.96 1.01  GLUCOSE 122* 139*  INR 1.04  --   CALCIUM 9.0 8.4    Examination: Neurologically intact ABD soft Neurovascular intact Sensation intact distally Intact pulses distally Dorsiflexion/Plantar flexion intact Incision: no drainage No cellulitis present Compartment soft} XR AP&Lat of hip shows well placed\fixed THA  Assessment:   1 Day Post-Op Procedure(s) (LRB): TOTAL HIP ARTHROPLASTY (Left) ADDITIONAL DIAGNOSIS:  Hypertension  Plan: PT/OT WBAT, THA  posterior precautions  DVT Prophylaxis: SCDx72 hrs, ASA 325 mg BID x 2 weeks  DISCHARGE PLAN: Home  DISCHARGE NEEDS: HHPT, HHRN, CPM, Walker and 3-in-1 comode seat

## 2013-06-02 NOTE — Progress Notes (Signed)
Physical Therapy Treatment Patient Details Name: Shane PennerRobert W Prince MRN: [id number] DOB: [birth year] Today's Date: 06/02/2013 Time: [id number] PT Time Calculation (min): 11 min  PT Assessment / Plan / Recommendation  History of Present Illness pt presents with L THA.     PT Comments   Pt continues to require extensive A for mobility and safety.  At one point pt indicates pain is better than this am, but another time states pain is 10/10 despite RN having premedicated pt.  Pt is still hopeful for D/C to home, however safest D/C plan is for ST-SNF to maximize independence prior to returning to home with daughters to A.  Will continue to follow.    Follow Up Recommendations  SNF     Does the patient have the potential to tolerate intense rehabilitation     Barriers to Discharge        Equipment Recommendations  None recommended by PT    Recommendations for Other Services    Frequency 7X/week   Progress towards PT Goals Progress towards PT goals: Progressing toward goals  Plan Discharge plan needs to be updated    Precautions / Restrictions Precautions Precautions: Fall;Posterior Hip Precaution Booklet Issued: No Precaution Comments: Reviewed precautions with pt Restrictions Weight Bearing Restrictions: Yes LLE Weight Bearing: Weight bearing as tolerated   Pertinent Vitals/Pain Initially stated "Not much", but then states 10/10 when asked again.  RN premedicated.      Mobility  Bed Mobility Bed Mobility: Not assessed Transfers Transfers: Sit to Stand;Stand to Sit Sit to Stand: 3: Mod assist;With upper extremity assist;From chair/3-in-1;With armrests Stand to Sit: 3: Mod assist;With upper extremity assist;To chair/3-in-1;With armrests Details for Transfer Assistance: cues for UE use and to control descent to sitting as pt tends to "fall" back in recliner.   Ambulation/Gait Ambulation/Gait Assistance: 4: Min assist Ambulation Distance (Feet): 10 Feet Assistive device: Rolling  walker Ambulation/Gait Assistance Details: cues for upright posture, gait sequencing, use of RW, safety, and encouragement.  pt not very receptive to safety cues and fatigues quickly.   Gait Pattern: Step-to pattern;Decreased step length - right;Decreased stance time - left;Trunk flexed Stairs: No Wheelchair Mobility Wheelchair Mobility: No    Exercises Total Joint Exercises Heel Slides: AROM;Left;10 reps;Seated Long Arc Quad: AROM;Left;10 reps   PT Diagnosis:    PT Problem List:   PT Treatment Interventions:     PT Goals (current goals can now be found in the care plan section) Acute Rehab PT Goals Patient Stated Goal: Home Time For Goal Achievement: 06/09/13 Potential to Achieve Goals: Good  Visit Information  Last PT Received On: 06/02/13 Assistance Needed: +2 (Ambulation) History of Present Illness: pt presents with L THA.      Subjective Data  Patient Stated Goal: Home   Cognition  Cognition Arousal/Alertness: Awake/alert Behavior During Therapy: WFL for tasks assessed/performed Overall Cognitive Status: Within Functional Limits for tasks assessed    Balance  Balance Balance Assessed: No  End of Session PT - End of Session Equipment Utilized During Treatment: Gait belt Activity Tolerance: Patient limited by fatigue;Patient limited by pain Patient left: in chair;with call bell/phone within reach;with family/visitor present Nurse Communication: Mobility status   GP     Sunny SchleinRitenour, Shane Flott F, [place]PT [phone] 06/02/2013, 2:29 PM

## 2013-06-02 NOTE — Progress Notes (Signed)
Pt c/o severe pain to left hip medicated and assisted by PT and Nursing staff in a chair at bed side  Call bell within reach pt verbalized relief of pain rated 3/10 at present. Will continue to monitor.

## 2013-06-03 ENCOUNTER — Encounter (HOSPITAL_COMMUNITY): Payer: Self-pay | Admitting: Orthopedic Surgery

## 2013-06-03 LAB — CBC
HCT: 32.7 % — ABNORMAL LOW (ref 39.0–52.0)
Hemoglobin: 11.2 g/dL — ABNORMAL LOW (ref 13.0–17.0)
MCH: 29.7 pg (ref 26.0–34.0)
MCHC: 34.3 g/dL (ref 30.0–36.0)
MCV: 86.7 fL (ref 78.0–100.0)
Platelets: 132 10*3/uL — ABNORMAL LOW (ref 150–400)
RBC: 3.77 MIL/uL — ABNORMAL LOW (ref 4.22–5.81)
RDW: 14.7 % (ref 11.5–15.5)
WBC: 10.7 10*3/uL — AB (ref 4.0–10.5)

## 2013-06-03 MED ORDER — ASPIRIN EC 325 MG PO TBEC: 325.0000 mg | DELAYED_RELEASE_TABLET | Freq: Two times a day (BID) | ORAL | Status: DC

## 2013-06-03 MED ORDER — OXYCODONE-ACETAMINOPHEN 5-325 MG PO TABS: 1.0000 | ORAL_TABLET | ORAL | Status: DC | PRN

## 2013-06-03 MED ORDER — TIZANIDINE HCL 2 MG PO CAPS: 2.0000 mg | ORAL_CAPSULE | Freq: Three times a day (TID) | ORAL | Status: DC

## 2013-06-03 NOTE — Progress Notes (Signed)
OT Cancellation Note  Patient Details Name: Shane PennerRobert W Prince MRN: [id number] DOB: [birth year]   Cancelled Treatment:    Reason Eval/Treat Not Completed: Other (comment) (pt refusing, saying he wants nap)  Earlie RavelingStraub, Cherylann Hobday L OTR/L [phone] 06/03/2013, 4:38 PM

## 2013-06-03 NOTE — Progress Notes (Signed)
PATIENT ID: Shane PennerRobert W Prince  MRN: [id number]  DOB/AGE:  [birth year] / 80 y.o.  2 Days Post-Op Procedure(s) (LRB): TOTAL HIP ARTHROPLASTY (Left)    PROGRESS NOTE Subjective: Patient is alert, oriented,no Nausea, no Vomiting, yes passing gas, no Bowel Movement. Taking PO. Denies SOB, Chest or Calf Pain. Using Incentive Spirometer, PAS in place. Ambulate WBAT Patient reports pain as moderate  .    Objective: Vital signs in last 24 hours: Filed Vitals:   06/02/13 1101 06/02/13 1400 06/02/13 2200 06/03/13 0502  BP: 146/69 136/71 132/72 148/68  Pulse: 97 100 101 98  Temp: 97.1 F (36.2 C) 99.9 F (37.7 C) 98.9 F (37.2 C) 98.1 F (36.7 C)  TempSrc: Oral Oral Oral Oral  Resp: 18 18 18 18   Height:      Weight:      SpO2: 99% 93% 100% 96%      Intake/Output from previous day: I/O last 3 completed shifts: In: 1200 [P.O.:1200] Out: 1130 [Urine:1130]   Intake/Output this shift:     LABORATORY DATA:  Recent Labs  06/01/13 1000 06/02/13 0620 06/03/13 0400  WBC 7.4 10.1 10.7*  HGB 13.9 11.5* 11.2*  HCT 40.0 34.3* 32.7*  PLT 182 156 132*  NA 140 137  --   K 4.2 4.3  --   CL 106 100  --   CO2 21 26  --   BUN 16 13  --   CREATININE 0.96 1.01  --   GLUCOSE 122* 139*  --   INR 1.04  --   --   CALCIUM 9.0 8.4  --     Examination: Neurologically intact Neurovascular intact Sensation intact distally Intact pulses distally Dorsiflexion/Plantar flexion intact Incision: dressing C/D/I No cellulitis present Compartment soft} XR AP&Lat of hip shows well placed\fixed THA  Assessment:   2 Days Post-Op Procedure(s) (LRB): TOTAL HIP ARTHROPLASTY (Left) ADDITIONAL DIAGNOSIS:  Hypertension  Plan: PT/OT WBAT, THA  posterior precautions  DVT Prophylaxis: SCDx72 hrs, ASA 325 mg BID x 2 weeks  DISCHARGE PLAN: Home when pt passes PT.  DISCHARGE NEEDS: HHPT, HHRN, Walker and 3-in-1 comode seat

## 2013-06-03 NOTE — Progress Notes (Signed)
Physical Therapy Treatment Patient Details Name: Shane PennerRobert W Delbridge MRN: [id number] DOB: Nov 07, 1926 Today's Date: 06/03/2013 Time: [id number] PT Time Calculation (min): 31 min  PT Assessment / Plan / Recommendation  History of Present Illness pt presents with L THA.     PT Comments   Patient progressing well. Able to increase ambulation this session. Requiring about Min A overall.  Follow Up Recommendations  SNF     Does the patient have the potential to tolerate intense rehabilitation     Barriers to Discharge        Equipment Recommendations  None recommended by PT    Recommendations for Other Services    Frequency 7X/week   Progress towards PT Goals Progress towards PT goals: Progressing toward goals  Plan Discharge plan needs to be updated    Precautions / Restrictions Precautions Precautions: Fall;Posterior Hip Precaution Comments: Reviewed precautions with pt Restrictions LLE Weight Bearing: Weight bearing as tolerated   Pertinent Vitals/Pain no apparent distress     Mobility  Ambulation/Gait Ambulation Distance (Feet): 600 Feet Gait velocity: Cues to increase step length and stand tall    Exercises Total Joint Exercises Ankle Circles/Pumps: AROM;Both;10 reps Quad Sets: AROM;Both;10 reps Heel Slides: AROM;Left;10 reps;Seated Long Arc Quad: AROM;Left;10 reps   PT Diagnosis:    PT Problem List:   PT Treatment Interventions:     PT Goals (current goals can now be found in the care plan section)    Visit Information  Last PT Received On: 06/03/13 Assistance Needed: +1 History of Present Illness: pt presents with L THA.      Subjective Data      Cognition  Cognition Arousal/Alertness: Awake/alert Behavior During Therapy: WFL for tasks assessed/performed Overall Cognitive Status: Within Functional Limits for tasks assessed    Balance     End of Session PT - End of Session Equipment Utilized During Treatment: Gait belt Activity Tolerance: Patient  tolerated treatment well Patient left: in chair;with call bell/phone within reach;with family/visitor present Nurse Communication: Mobility status   GP     Fredrich BirksRobinette, Julia Elizabeth 06/03/2013, 11:40 AM 06/03/2013 Fredrich Birksobinette, Julia Elizabeth PTA [phone] pager [phone] office

## 2013-06-03 NOTE — Progress Notes (Signed)
Physical Therapy Treatment Patient Details Name: Shane PennerRobert W Prince MRN: [id number] DOB: 1926-09-17 Today's Date: 06/03/2013 Time: [id number] PT Time Calculation (min): 17 min  PT Assessment / Plan / Recommendation  History of Present Illness pt presents with L THA.     PT Comments   Patient motivated to ambulate again. Having increased difficulty standing from lower surface of recliner. Patient and daughters to discuss rehab options. Daughters wanting SNF however patient wants to go home. Will follow up in AM  Follow Up Recommendations  SNF     Does the patient have the potential to tolerate intense rehabilitation     Barriers to Discharge        Equipment Recommendations  None recommended by PT    Recommendations for Other Services    Frequency 7X/week   Progress towards PT Goals Progress towards PT goals: Progressing toward goals  Plan Current plan remains appropriate    Precautions / Restrictions Precautions Precautions: Fall;Posterior Hip Precaution Comments: Reviewed precautions with pt Restrictions LLE Weight Bearing: Weight bearing as tolerated   Pertinent Vitals/Pain no apparent distress     Mobility  Bed Mobility Overal bed mobility: Needs Assistance Bed Mobility: Sit to Supine Supine to sit: Min assist Sit to supine: Min assist General bed mobility comments: A for LLE out of bed and cues for technique Transfers Overall transfer level: Needs assistance Transfers: Sit to/from Stand Sit to Stand: Mod assist General transfer comment: Increased assistance required from lower surface of recliner. Cues for technique Ambulation/Gait Ambulation/Gait assistance: Min guard Ambulation Distance (Feet): 150 Feet Assistive device: Rolling walker (2 wheeled) Gait Pattern/deviations: Step-through pattern Gait velocity: Cues to increase step length and stand tall    Exercises Total Joint Exercises Ankle Circles/Pumps: AROM;Both;10 reps Quad Sets: AROM;Both;10 reps Heel  Slides: AROM;Left;10 reps;Seated Long Arc Quad: AROM;Left;10 reps   PT Diagnosis:    PT Problem List:   PT Treatment Interventions:     PT Goals (current goals can now be found in the care plan section)    Visit Information  Last PT Received On: 06/03/13 Assistance Needed: +1 History of Present Illness: pt presents with L THA.      Subjective Data      Cognition  Cognition Arousal/Alertness: Awake/alert Behavior During Therapy: WFL for tasks assessed/performed Overall Cognitive Status: Within Functional Limits for tasks assessed    Balance     End of Session PT - End of Session Equipment Utilized During Treatment: Gait belt Activity Tolerance: Patient tolerated treatment well Patient left: with call bell/phone within reach;with family/visitor present;in bed Nurse Communication: Mobility status   GP     Shane Prince, Shane Prince 06/03/2013, 1:46 PM 06/03/2013 Shane Birksobinette, Shane Prince PTA [phone] pager [phone] office

## 2013-06-04 LAB — CBC
HCT: 31 % — ABNORMAL LOW (ref 39.0–52.0)
HEMOGLOBIN: 10.9 g/dL — AB (ref 13.0–17.0)
MCH: 30.4 pg (ref 26.0–34.0)
MCHC: 35.2 g/dL (ref 30.0–36.0)
MCV: 86.4 fL (ref 78.0–100.0)
Platelets: 120 10*3/uL — ABNORMAL LOW (ref 150–400)
RBC: 3.59 MIL/uL — ABNORMAL LOW (ref 4.22–5.81)
RDW: 14.5 % (ref 11.5–15.5)
WBC: 10.4 10*3/uL (ref 4.0–10.5)

## 2013-06-04 NOTE — Progress Notes (Signed)
UR complete.  Mychal Durio RN, MSN 

## 2013-06-04 NOTE — Progress Notes (Signed)
Pt discharge home. Pt discharge education and instructions completed with pt and spouse in room. Both denies any other questions. Pt dsg remains dry and intact; IV removed and pt administered the requested prn pain medication prior to discharge. Pt transported off unit via wheelchair with spouse and belongings at side.

## 2013-06-04 NOTE — Discharge Summary (Signed)
Patient ID: Shane Prince MRN: [id number] DOB/AGE: 78-May-1928 78 y.o.  Admit date: 06/01/2013 Discharge date: 06/04/2013  Admission Diagnoses:  Left hip osteoarthritis  Discharge Diagnoses:  Same  Past Medical History  Diagnosis Date  . Hypertension     denies medication use  . HOH (hard of hearing) left    doesn't use hearing aid  . Complication of anesthesia     blister on eye 2 times after surgery.  . Arthritis     Surgeries: Procedure(s): TOTAL HIP ARTHROPLASTY on 06/01/2013   Consultants:    Discharged Condition: Improved  Hospital Course: STONEWALL DOSS is an 80 y.o. male who was admitted 06/01/2013 for operative treatment of left hip osteoarthritis. Patient has severe unremitting pain that affects sleep, daily activities, and work/hobbies. After pre-op clearance the patient was taken to the operating room on 06/01/2013 and underwent  Procedure(s): TOTAL HIP ARTHROPLASTY.    Patient was given perioperative antibiotics: Anti-infectives   Start     Dose/Rate Route Frequency Ordered Stop   06/02/13 1530  acyclovir (ZOVIRAX) 200 MG capsule 200 mg     200 mg Oral 5 times daily 06/02/13 1527 06/09/13 1359   06/01/13 1313  cefUROXime (ZINACEF) injection  Status:  Discontinued       As needed 06/01/13 1313 06/01/13 1431   06/01/13 0600  ceFAZolin (ANCEF) IVPB 2 g/50 mL premix     2 g 100 mL/hr over 30 Minutes Intravenous On call to O.R. 05/31/13 1607 06/01/13 1220       Patient was given sequential compression devices, early ambulation, and chemoprophylaxis to prevent DVT. Patient progressed well in physical therapy and walked over 150 feet prior to discharge, his ,wound only had minimal drainage.he did develop a small fever blister for which he was treated with acyclovir.   Patient benefited maximally from hospital stay and there were no complications.    Recent vital signs: Patient Vitals for the past 24 hrs:  BP Temp Temp src Pulse Resp SpO2  06/04/13 0528 143/73 mmHg 98  F (36.7 C) Oral 80 18 96 %  06/04/13 0200 139/72 mmHg 98.2 F (36.8 C) Oral 84 18 96 %  06/03/13 2030 162/63 mmHg 100.6 F (38.1 C) Oral 93 18 97 %  06/03/13 1746 141/59 mmHg 99.9 F (37.7 C) Oral 98 18 96 %  06/03/13 1600 - - - - 18 98 %  06/03/13 1330 147/72 mmHg 99.8 F (37.7 C) Oral 72 18 100 %  06/03/13 1200 - - - - 18 96 %  06/03/13 0930 129/70 mmHg 98.7 F (37.1 C) Oral 94 18 92 %     Recent laboratory studies:  Recent Labs  06/01/13 1000 06/02/13 0620 06/03/13 0400 06/04/13 0401  WBC 7.4 10.1 10.7* 10.4  HGB 13.9 11.5* 11.2* 10.9*  HCT 40.0 34.3* 32.7* 31.0*  PLT 182 156 132* 120*  NA 140 137  --   --   K 4.2 4.3  --   --   CL 106 100  --   --   CO2 21 26  --   --   BUN 16 13  --   --   CREATININE 0.96 1.01  --   --   GLUCOSE 122* 139*  --   --   INR 1.04  --   --   --   CALCIUM 9.0 8.4  --   --      Discharge Medications:     Medication List    STOP taking  these medications       HYDROcodone-acetaminophen 5-325 MG per tablet  Commonly known as:  NORCO/VICODIN     naproxen sodium 220 MG tablet  Commonly known as:  ANAPROX      TAKE these medications       amLODipine 5 MG tablet  Commonly known as:  NORVASC  Take 5 mg by mouth daily.     aspirin EC 325 MG tablet  Take 1 tablet (325 mg total) by mouth 2 (two) times daily.     oxyCODONE-acetaminophen 5-325 MG per tablet  Commonly known as:  ROXICET  Take 1 tablet by mouth every 4 (four) hours as needed.     tizanidine 2 MG capsule  Commonly known as:  ZANAFLEX  Take 1 capsule (2 mg total) by mouth 3 (three) times daily.        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 Stable cardiomegaly without CHF, pneumonia, collapse or consolidation.  No effusion or pneumothorax. Trachea is midline. Atherosclerosis of the aorta. Degenerative changes of the spine.  IMPRESSION: Stable bilateral calcified pleural plaques.  Cardiomegaly without CHF or pneumonia  Stable exam   Electronically Signed   By: Ruel Favors M.D.   On: 06/01/2013 11:54   Dg Pelvis Portable  06/01/2013   CLINICAL DATA:  Postop left hip.  EXAM: PORTABLE PELVIS 1-2 VIEWS  COMPARISON:  None.  FINDINGS: Post total left hip replacement which appears in satisfactory position without complication noted on this single projection.  Remote right hip replacement.  Prominent lumbar spine degenerative changes.  IMPRESSION: Post left total hip replacement which appears in satisfactory position without complication noted on this single projection.   Electronically Signed   By: Bridgett Larsson M.D.   On: 06/01/2013 15:46    Disposition: 01-Home or Self Care      Discharge Orders   Future Orders Complete By Expires   Call MD / Call 911  As directed    Comments:     If you experience chest pain or shortness of breath, CALL 911 and be transported to the hospital emergency room.  If you develope a fever above 101 F, pus (white drainage) or increased drainage or redness at the wound, or calf pain, call your surgeon's office.   Change dressing  As directed    Comments:     You may change your dressing on POD #5   Constipation Prevention  As directed    Comments:     Drink plenty of fluids.  Prune juice may be helpful.  You may use a stool softener, such as Colace (over the counter) 100 mg twice a day.  Use MiraLax (over the counter) for constipation as needed.   Diet - low sodium heart healthy  As directed    Do not put a pillow under the knee. Place it under the heel.  As directed    Driving restrictions  As directed    Comments:     No driving for 2 weeks   Follow the hip precautions as taught in Physical Therapy  As directed    Increase activity slowly as tolerated  As directed     Patient may shower  As directed    Comments:     You may shower without  a dressing once there is no drainage.  Do not wash over the wound.  If drainage remains, cover wound with plastic wrap and then shower.      Follow-up Information   Follow up with Nestor LewandowskyOWAN,Genetta Fiero J, MD In 2 weeks.   Specialty:  Orthopedic Surgery   Contact information:   [address] [place] [address] [phone]       Follow up with Nestor LewandowskyOWAN,Lydie Stammen J, MD In 1 week.   Specialty:  Orthopedic Surgery   Contact information:   [address] [place] [address] [phone]        Signed: Nestor LewandowskyROWAN,Courtnie Brenes J 06/04/2013, 9:08 AM

## 2013-06-04 NOTE — Progress Notes (Signed)
Physical Therapy Treatment Patient Details Name: Shane Shane Prince MRN: [id number] DOB: 1926-06-21 Today's Date: 06/04/2013 Time: 0750-0829 PT Time Calculation (min): 39 min  PT Assessment / Plan / Recommendation  History of Present Illness pt presents with L THA.     PT Comments   Patient is progressing towards goals. Cues needed throughout for best techniques and safety. Patient tends to want little instruction and is reluctant to take cues. Patient is planning to go home with assist from daughters. Patient is equipped with AD and plans to sleep in lift chair for a while once at home.   Follow Up Recommendations  Home health PT;Supervision/Assistance - 24 hour;Supervision for mobility/OOB     Does the patient have the potential to tolerate intense rehabilitation     Barriers to Discharge        Equipment Recommendations  None recommended by PT    Recommendations for Other Services    Frequency 7X/week   Progress towards PT Goals Progress towards PT goals: Progressing toward goals  Plan Discharge plan needs to be updated    Precautions / Restrictions Precautions Precautions: Fall;Posterior Hip Precaution Comments: Patient able to recall all precautions Restrictions LLE Weight Bearing: Weight bearing as tolerated   Pertinent Vitals/Pain no apparent distress     Mobility  Bed Mobility Supine to sit: Min guard;HOB elevated Sit to supine: Min guard;HOB elevated General bed mobility comments: Patient using rails despite encouragement to attempt without.  Transfers Overall transfer level: Needs assistance Sit to Stand: Min assist General transfer comment: A to ensure balance with standing. CUes for LLE positioning and hand placement. Daughter holding RW for support Ambulation/Gait Ambulation/Gait assistance: Min guard Ambulation Distance (Feet): 120 Feet Assistive device: Rolling walker (2 wheeled) General Gait Details: Cues to increase step length and stand tall Stairs:  Yes Stairs assistance: Min assist Stair Management: Step to pattern;Forwards;Two rails Number of Stairs: 4 General stair comments: Cues for sequence and technique    Exercises     PT Diagnosis:    PT Problem List:   PT Treatment Interventions:     PT Goals (current goals can now be found in the care plan section)    Visit Information  Last PT Received On: 06/04/13 Assistance Needed: +1 History of Present Illness: pt presents with L THA.      Subjective Data      Cognition  Cognition Arousal/Alertness: Awake/alert Behavior During Therapy: WFL for tasks assessed/performed Overall Cognitive Status: Within Functional Limits for tasks assessed    Balance     End of Session PT - End of Session Equipment Utilized During Treatment: Gait belt Activity Tolerance: Patient tolerated treatment well Patient left: with call bell/phone within reach;with family/visitor present;in bed Nurse Communication: Mobility status   GP     Fredrich BirksRobinette, Julia Elizabeth 06/04/2013, 8:34 AM 06/04/2013 Fredrich Birksobinette, Julia Elizabeth PTA [phone] pager [phone] office

## 2013-06-04 NOTE — Progress Notes (Signed)
Patient ID: Shane PennerRobert W Prince, male   DOB: 18-Sep-1926, 80 y.o.   MRN: [id number] PATIENT ID: Shane PennerRobert W Prince  MRN: [id number]  DOB/AGE:  78-Apr-1928 / 80 y.o.  3 Days Post-Op Procedure(s) (LRB): TOTAL HIP ARTHROPLASTY (Left)    PROGRESS NOTE Subjective: Patient is alert, oriented,no Nausea, no Vomiting, yes passing gas, no Bowel Movement. Taking PO well. Denies SOB, Chest or Calf Pain. Using Incentive Spirometer, PAS in place. Ambulate 120 ft Patient reports pain as 3 on 0-10 scale  .    Objective: Vital signs in last 24 hours: Filed Vitals:   06/03/13 1746 06/03/13 2030 06/04/13 0200 06/04/13 0528  BP: 141/59 162/63 139/72 143/73  Pulse: 98 93 84 80  Temp: 99.9 F (37.7 C) 100.6 F (38.1 C) 98.2 F (36.8 C) 98 F (36.7 C)  TempSrc: Oral Oral Oral Oral  Resp: 18 18 18 18   Height:      Weight:      SpO2: 96% 97% 96% 96%      Intake/Output from previous day: I/O last 3 completed shifts: In: 1400 [P.O.:1400] Out: 400 [Urine:400]   Intake/Output this shift:     LABORATORY DATA:  Recent Labs  06/01/13 1000 06/02/13 0620 06/03/13 0400 06/04/13 0401  WBC 7.4 10.1 10.7* 10.4  HGB 13.9 11.5* 11.2* 10.9*  HCT 40.0 34.3* 32.7* 31.0*  PLT 182 156 132* 120*  NA 140 137  --   --   K 4.2 4.3  --   --   CL 106 100  --   --   CO2 21 26  --   --   BUN 16 13  --   --   CREATININE 0.96 1.01  --   --   GLUCOSE 122* 139*  --   --   INR 1.04  --   --   --   CALCIUM 9.0 8.4  --   --     Examination: Neurologically intact ABD soft Neurovascular intact Sensation intact distally Intact pulses distally Dorsiflexion/Plantar flexion intact Incision: scant drainage No cellulitis present Compartment soft} XR AP&Lat of hip shows well placed\fixed THA  Assessment:   3 Days Post-Op Procedure(s) (LRB): TOTAL HIP ARTHROPLASTY (Left) ADDITIONAL DIAGNOSIS:  Hypertension  Plan: PT/OT WBAT, THA  posterior precautions  DVT Prophylaxis: SCDx72 hrs, ASA 325 mg BID x 2 weeks  DISCHARGE  PLAN: Home, probably today assuming patient passes all PT goals  DISCHARGE NEEDS: HHPT, HHRN, CPM, Walker and 3-in-1 comode seat

## 2013-06-04 NOTE — Progress Notes (Signed)
Occupational Therapy Treatment Patient Details Name: Shane Prince MRN: [id number] DOB: 1926/07/24 Today's Date: 06/04/2013 Time: 9629-5284 OT Time Calculation (min): 35 min  OT Assessment / Plan / Recommendation  History of present illness Pt presents s/p L THA.     OT comments  Pt progressing toward POC/goals for acute OT and plans for d/c home later today w/ PRN daughters assist. Pt cont to require increased assistance for ADL's, self care tasks and functional transfers. He will benefit from [facility] when d/c home.   Follow Up Recommendations  Home health OT;Supervision/Assistance - 24 hour    Barriers to Discharge       Equipment Recommendations  None recommended by OT    Recommendations for Other Services    Frequency Min 2X/week   Progress towards OT Goals Progress towards OT goals: Progressing toward goals  Plan Discharge plan remains appropriate    Precautions / Restrictions Precautions Precautions: Fall;Posterior Hip Precaution Comments: Patient able to recall all precautions Restrictions Weight Bearing Restrictions: Yes LLE Weight Bearing: Weight bearing as tolerated   Pertinent Vitals/Pain No c/o    ADL  Eating/Feeding: Performed;Independent Where Assessed - Eating/Feeding: Bed level Grooming: Performed;Wash/dry hands;Wash/dry face;Supervision/safety (Standing at sink) Where Assessed - Grooming: Supported standing Lower Body Bathing: Performed;Moderate assistance Where Assessed - Lower Body Bathing: Supported sit to stand Upper Body Dressing: Performed;Set up Where Assessed - Upper Body Dressing: Unsupported sitting Lower Body Dressing: Performed;Moderate assistance Where Assessed - Lower Body Dressing: Supported sit to Pharmacist, hospital: Performed;Minimal Dentist Method: Sit to Barista: Raised toilet seat with arms (or 3-in-1 over toilet) Toileting - Clothing Manipulation and Hygiene: Min guard Where Assessed -  Toileting Clothing Manipulation and Hygiene: Sit to stand from 3-in-1 or toilet Equipment Used: Gait belt;Rolling walker;Reacher Transfers/Ambulation Related to ADLs: Min/Mod A for transfers. ADL Comments: Pt participated in ADL retraining session ambulating into bathroom & susing 3:1 over toilet w/ Min guard assist. Pt moves slowly and requires increased time for all tasks as well as encouragement for participation, prefers if family/OT performs task. Pt was educated in LB dressing tech's and use of LH reacher/A/E was discussed w/ pt. Reviewed THP, in context of ADL's, pt verbalized understanding. Daughter present throughout session & children/daughters to assist PRN at d/c.    OT Diagnosis:    OT Problem List:   OT Treatment Interventions:     OT Goals(current goals can now be found in the care plan section) Acute Rehab OT Goals Patient Stated Goal: Home  Visit Information  Last OT Received On: 06/04/13 Assistance Needed: +1 History of Present Illness: Pt presents s/p L THA.      Subjective Data      Prior Functioning       Cognition  Cognition Arousal/Alertness: Awake/alert Behavior During Therapy: WFL for tasks assessed/performed Overall Cognitive Status: Within Functional Limits for tasks assessed    Mobility  Bed Mobility Overal bed mobility: Needs Assistance Bed Mobility: Supine to Sit Supine to sit: Min guard;HOB elevated Sit to supine: Min guard;HOB elevated General bed mobility comments: Pt using rails and HOB elevated for in/out of bed despite maximal verbal encouragement to attempt w/o & HOB flat. Transfers Overall transfer level: Needs assistance Equipment used: Rolling walker (2 wheeled) Transfers: Sit to/from Stand Sit to Stand: Min assist;Mod assist General transfer comment: A for balance in standing, cues for LLE positioning, hand placement. Daughter & OT holding RW for support as pt performed sit to stand w/ LUE on RW  despite cues to push from bed.             End of Session OT - End of Session Equipment Utilized During Treatment: Gait belt;Rolling walker Activity Tolerance: Patient tolerated treatment well;Patient limited by fatigue Patient left: in bed;with bed alarm set;with family/visitor present Nurse Communication: Other (comment);Mobility status (ADl's, dressing completed & pt ready for d/c home )  GO     Alm BustardBarnhill, Amy Beth Dixon 06/04/2013, 11:29 AM

## 2013-06-05 NOTE — Progress Notes (Signed)
Agree with PTA.    Damari Suastegui, PT [phone]  

## 2013-09-21 ENCOUNTER — Ambulatory Visit (INDEPENDENT_AMBULATORY_CARE_PROVIDER_SITE_OTHER): Payer: Medicare PPO | Admitting: Family Medicine

## 2013-09-21 ENCOUNTER — Encounter: Payer: Self-pay | Admitting: Family Medicine

## 2013-09-21 VITALS — BP 160/80 | HR 76 | Temp 97.5°F | Ht 72.0 in | Wt 238.5 lb

## 2013-09-21 DIAGNOSIS — R2689 Other abnormalities of gait and mobility: Secondary | ICD-10-CM

## 2013-09-21 DIAGNOSIS — L570 Actinic keratosis: Secondary | ICD-10-CM

## 2013-09-21 DIAGNOSIS — R269 Unspecified abnormalities of gait and mobility: Secondary | ICD-10-CM

## 2013-09-21 DIAGNOSIS — I10 Essential (primary) hypertension: Secondary | ICD-10-CM

## 2013-09-21 NOTE — Progress Notes (Signed)
Pre visit review using our clinic review tool, if applicable. No additional management support is needed unless otherwise documented below in the visit note. 

## 2013-09-21 NOTE — Patient Instructions (Addendum)
Good to meet you today. No changes to medicines. Continue amlodipine 5mg  daily. Return at your convenience fasting for blood work and afterwards for wellness exam/physical (3-4 months). Return sooner if needed.

## 2013-09-21 NOTE — Assessment & Plan Note (Signed)
Encouraged regular hat and sunscreen use.

## 2013-09-21 NOTE — Assessment & Plan Note (Signed)
Noted imbalance today but doesn't seem cerebellar in nature.  ?periph neuropathy given trouble with proprioception/romberg.  Check B12 next fasting blood work and check monofilament testing.

## 2013-09-21 NOTE — Progress Notes (Signed)
BP 160/80  Pulse 76  Temp(Src) 97.5 F (36.4 C) (Oral)  Ht 6' (1.829 m)  Wt 238 lb 8 oz (108.183 kg)  BMI 32.34 kg/m2   CC: new pt to establish  Subjective:    Patient ID: Shane Prince, male    DOB: 1926/08/07, 80 y.o.   MRN: [id number]  HPI: Shane PennerRobert W Diekman is a 80 y.o. male presenting on 09/21/2013 for Establish Care   [facility]  Records arrived prior to office visit.  Prolonged issues with balance over last few months - even before hip replacement 05/2013 ([place]).  Denies urinary incontinence, denies AMS.  Denies foot paresthesias.  Endorses some trouble with memory but not alarmed with this.  HTN - chronic elevation, prior on losartan and amlodipine, now currently only on amlodipine 5mg  daily.  No HA, vision changes, CP/tightness, SOB, leg swelling.  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  Allergies and medications reviewed and updated. Current Outpatient Prescriptions on File Prior to Visit  Medication Sig  . amLODipine (NORVASC) 5 MG tablet Take 5 mg by mouth daily.   No current facility-administered medications on file prior to visit.    Review of Systems Per HPI unless specifically indicated above    Objective:    BP 160/80  Pulse 76  Temp(Src) 97.5 F (36.4 C) (Oral)  Ht 6' (1.829 m)  Wt 238 lb 8 oz (108.183 kg)  BMI 32.34 kg/m2  Physical Exam  Nursing note and vitals reviewed. Constitutional: He is oriented to person, place, and time. He appears well-developed and well-nourished. No distress.  HENT:  Head: Normocephalic and atraumatic.  Right Ear: Hearing, tympanic membrane, external ear and ear canal  normal.  Left Ear: Hearing, tympanic membrane, external ear and ear canal normal.  Nose: Nose normal.  Mouth/Throat: Uvula is midline, oropharynx is clear and moist and mucous membranes are normal. No oropharyngeal exudate, posterior oropharyngeal edema or posterior oropharyngeal erythema.  Eyes: Conjunctivae and EOM are normal. Pupils are equal, round, and reactive to light. No scleral icterus.  Neck: Normal range of motion. Neck supple. Carotid bruit is not present. No thyromegaly present.  Cardiovascular: Normal rate, regular rhythm, normal heart sounds and intact distal pulses.   No murmur heard. Pulses:      Radial pulses are 2+ on the right side, and 2+ on the left side.  Pulmonary/Chest: Effort normal and breath sounds normal. No respiratory distress. He has no wheezes. He has no rales.  Musculoskeletal: Normal range of motion. He exhibits no edema.  Lymphadenopathy:    He has no cervical adenopathy.  Neurological: He is alert and oriented to person, place, and time. Coordination and gait abnormal.  CN grossly intact Unsteady with romberg No dysmetria, nl FTN Slightly wide based gait but corrects well  Skin: Skin is warm and dry. No rash noted.  Psychiatric: He has a normal mood and affect. His behavior is normal. Judgment and thought content normal.       Assessment & Plan:   Problem List Items Addressed This Visit   Imbalance - Primary     Noted imbalance today but doesn't seem cerebellar in nature.  ?periph  neuropathy given trouble with proprioception/romberg.  Check B12 next fasting blood work and check monofilament testing.    Hypertension     Chronically uncontrolled. No changes today.  Continue amlodipine 5mg  daily.    AK (actinic keratosis)     Encouraged regular hat and sunscreen use.        Follow up plan: Return in about 3 months (around 12/21/2013), or as needed, for annual exam, prior fasting for blood work.

## 2013-09-21 NOTE — Assessment & Plan Note (Signed)
Chronically uncontrolled. No changes today.  Continue amlodipine 5mg  daily.

## 2013-09-22 ENCOUNTER — Telehealth: Payer: Self-pay | Admitting: Family Medicine

## 2013-09-22 NOTE — Telephone Encounter (Signed)
Relevant patient education assigned to patient using Emmi. ° °

## 2013-12-14 ENCOUNTER — Other Ambulatory Visit (INDEPENDENT_AMBULATORY_CARE_PROVIDER_SITE_OTHER): Payer: Medicare PPO

## 2013-12-14 DIAGNOSIS — R2689 Other abnormalities of gait and mobility: Secondary | ICD-10-CM

## 2013-12-14 DIAGNOSIS — R269 Unspecified abnormalities of gait and mobility: Secondary | ICD-10-CM

## 2013-12-14 DIAGNOSIS — I10 Essential (primary) hypertension: Secondary | ICD-10-CM

## 2013-12-14 LAB — LIPID PANEL
Cholesterol: 178 mg/dL (ref 0–200)
HDL: 42.4 mg/dL (ref >39.00)
LDL Cholesterol: 115 mg/dL — ABNORMAL HIGH (ref 0–99)
NonHDL: 135.6
TRIGLYCERIDES: 102 mg/dL (ref 0.0–149.0)
Total CHOL/HDL Ratio: 4
VLDL: 20.4 mg/dL (ref 0.0–40.0)

## 2013-12-14 LAB — VITAMIN B12: VITAMIN B 12: 281 pg/mL (ref 211–911)

## 2013-12-14 LAB — COMPREHENSIVE METABOLIC PANEL
ALBUMIN: 4.1 g/dL (ref 3.5–5.2)
ALT: 15 U/L (ref 0–53)
AST: 22 U/L (ref 0–37)
Alkaline Phosphatase: 71 U/L (ref 39–117)
BUN: 13 mg/dL (ref 6–23)
CO2: 27 mEq/L (ref 19–32)
Calcium: 9.1 mg/dL (ref 8.4–10.5)
Chloride: 106 mEq/L (ref 96–112)
Creatinine, Ser: 1.1 mg/dL (ref 0.4–1.5)
GFR: 65.49 mL/min (ref >60.00)
GLUCOSE: 114 mg/dL — AB (ref 70–99)
POTASSIUM: 4.5 meq/L (ref 3.5–5.1)
SODIUM: 139 meq/L (ref 135–145)
Total Bilirubin: 0.9 mg/dL (ref 0.2–1.2)
Total Protein: 6.6 g/dL (ref 6.0–8.3)

## 2013-12-14 LAB — TSH: TSH: 1.83 u[IU]/mL (ref 0.35–4.50)

## 2013-12-21 ENCOUNTER — Ambulatory Visit (INDEPENDENT_AMBULATORY_CARE_PROVIDER_SITE_OTHER): Payer: Medicare PPO | Admitting: Family Medicine

## 2013-12-21 ENCOUNTER — Encounter: Payer: Self-pay | Admitting: Family Medicine

## 2013-12-21 ENCOUNTER — Encounter (INDEPENDENT_AMBULATORY_CARE_PROVIDER_SITE_OTHER): Payer: Self-pay

## 2013-12-21 VITALS — BP 142/78 | HR 68 | Temp 98.2°F | Ht 71.75 in | Wt 240.8 lb

## 2013-12-21 DIAGNOSIS — I10 Essential (primary) hypertension: Secondary | ICD-10-CM

## 2013-12-21 DIAGNOSIS — R269 Unspecified abnormalities of gait and mobility: Secondary | ICD-10-CM

## 2013-12-21 DIAGNOSIS — Z0000 Encounter for general adult medical examination without abnormal findings: Secondary | ICD-10-CM

## 2013-12-21 DIAGNOSIS — B351 Tinea unguium: Secondary | ICD-10-CM

## 2013-12-21 DIAGNOSIS — R2689 Other abnormalities of gait and mobility: Secondary | ICD-10-CM

## 2013-12-21 MED ORDER — SELENIUM SULFIDE 2.5 % EX LOTN: 1.0000 "application " | TOPICAL_LOTION | Freq: Every day | CUTANEOUS | Status: DC | PRN

## 2013-12-21 MED ORDER — TERBINAFINE HCL 250 MG PO TABS: 250.0000 mg | ORAL_TABLET | Freq: Every day | ORAL | Status: DC

## 2013-12-21 MED ORDER — AMLODIPINE BESYLATE 5 MG PO TABS: 5.0000 mg | ORAL_TABLET | Freq: Every day | ORAL | Status: DC

## 2013-12-21 MED ORDER — VITAMIN B-12 1000 MCG PO TABS: 1000.0000 ug | ORAL_TABLET | Freq: Every day | ORAL | Status: DC

## 2013-12-21 NOTE — Assessment & Plan Note (Signed)
Start terbinafine 250mg  daily for 22mo, start selenium sulfide lotion 2.5%. Reassess next visit.

## 2013-12-21 NOTE — Assessment & Plan Note (Signed)
I have personally reviewed the Medicare Annual Wellness questionnaire and have noted 1. The patient's medical and social history 2. Their use of alcohol, tobacco or illicit drugs 3. Their current medications and supplements 4. The patient's functional ability including ADL's, fall risks, home safety risks and hearing or visual impairment. 5. Diet and physical activity 6. Evidence for depression or mood disorders The patients weight, height, BMI have been recorded in the chart.  Hearing and vision has been addressed. I have made referrals, counseling and provided education to the patient based review of the above and I have provided the pt with a written personalized care plan for preventive services. Provider list updated - see scanned questionairre. Advanced directive - Does not want life support. Would want daughters to be HCPOA.   Reviewed preventative protocols and updated unless pt declined.

## 2013-12-21 NOTE — Progress Notes (Signed)
BP 142/78  Pulse 68  Temp(Src) 98.2 F (36.8 C) (Oral)  Ht 5' 11.75" (1.822 m)  Wt 240 lb 12 oz (109.203 kg)  BMI 32.90 kg/m2   CC: medicare wellness visit  Subjective:    Patient ID: Noelle PennerRobert W Siegrist, male    DOB: 04/21/1927, 80 y.o.   MRN: [id number]  HPI: Noelle PennerRobert W Fuertes is a 80 y.o. male presenting on 12/21/2013 for Annual Exam   Persistent trouble with balance ongoing over last 1+ year. Denies bowel/bladder accidents. Denies memory trouble. Rare EtOH.   Toenail fungus, as well as skin rash attributed to fungus lower legs and behind ears. Has been treated with terbinafine and selenium sulfide 2.5% lotion requests refill of both.  Persistent R hip pain s/p replacement 2008. Had L hip replacement 05/2013 and doing well.  Failed hearing screen. L>R. Bought hearing aide $2000, but didn't help. Feels this is getting better. Passes vision screen No falls or anhedonia or depression.  Preventative: Colon cancer screening - never had colonoscopy. Did have normal stool kits in past.  Denies blood in stool ur bowel changes. Flu - declines Pneumovax - declines Tetanus - declines Shingles - declines Advanced directive - Does not want life support. Would want daughters to be HCPOA.   Lives alone, grown children live in [place], one on same street  Occupation: retired Nutritional therapistplumber  Edu: 1 yr college  Activity: works out in yard, stretches in bed Diet: some water and gatorade, fruits/vegetables daily  Relevant past medical, surgical, family and social history reviewed and updated as indicated.  Allergies and medications reviewed and updated. Current Outpatient Prescriptions on File Prior to Visit  Medication Sig  . naproxen sodium (ANAPROX) 220 MG tablet Take 660 mg by mouth at bedtime.    No current facility-administered medications on file prior to visit.    Review of Systems Per HPI unless specifically indicated above    Objective:    BP 142/78  Pulse 68  Temp(Src) 98.2 F (36.8  C) (Oral)  Ht 5' 11.75" (1.822 m)  Wt 240 lb 12 oz (109.203 kg)  BMI 32.90 kg/m2  Physical Exam  Nursing note and vitals reviewed. Constitutional: He is oriented to person, place, and time. He appears well-developed and well-nourished. No distress.  HENT:  Head: Normocephalic and atraumatic.  Right Ear: Hearing, tympanic membrane, external ear and ear canal normal.  Left Ear: Hearing, tympanic membrane, external ear and ear canal normal.  Nose: Nose normal.  Mouth/Throat: Uvula is midline, oropharynx is clear and moist and mucous membranes are normal. No oropharyngeal exudate, posterior oropharyngeal edema or posterior oropharyngeal erythema.  Eyes: Conjunctivae and EOM are normal. Pupils are equal, round, and reactive to light. No scleral icterus.  Neck: Normal range of motion. Neck supple. Carotid bruit is not present. No thyromegaly present.  Cardiovascular: Normal rate, regular rhythm, normal heart sounds and intact distal pulses.   No murmur heard. Pulses:      Radial pulses are 2+ on the right side, and 2+ on the left side.  Pulmonary/Chest: Effort normal and breath sounds normal. No respiratory distress. He has no wheezes. He has no rales.  Abdominal: Soft. Bowel sounds are normal. He exhibits no distension and no mass. There is no tenderness. There is no rebound and no guarding.  Musculoskeletal: Normal range of motion. He exhibits no edema.  Foot exam: Scaling of skin R>L soles No skin breakdown noted No calluses  2+ DP pulses Normal sensation to light touch and monofilament  Nails onychomycotic  Lymphadenopathy:    He has no cervical adenopathy.  Neurological: He is alert and oriented to person, place, and time.  CN grossly intact, station and gait intact Recall 3/3  Calculation 5/5 serial 3s  Skin: Skin is warm and dry. No rash noted.  Psychiatric: He has a normal mood and affect. His behavior is normal. Judgment and thought content normal.   Results for orders placed  in visit on 12/14/13  VITAMIN B12      Result Value Ref Range   Vitamin B-12 281  211 - 911 pg/mL  LIPID PANEL      Result Value Ref Range   Cholesterol 178  0 - 200 mg/dL   Triglycerides 161.0  0.0 - 149.0 mg/dL   HDL 96.04  >54.09 mg/dL   VLDL 81.1  0.0 - 91.4 mg/dL   LDL Cholesterol 782 (*) 0 - 99 mg/dL   Total CHOL/HDL Ratio 4     NonHDL 135.60    COMPREHENSIVE METABOLIC PANEL      Result Value Ref Range   Sodium 139  135 - 145 mEq/L   Potassium 4.5  3.5 - 5.1 mEq/L   Chloride 106  96 - 112 mEq/L   CO2 27  19 - 32 mEq/L   Glucose, Bld 114 (*) 70 - 99 mg/dL   BUN 13  6 - 23 mg/dL   Creatinine, Ser 1.1  0.4 - 1.5 mg/dL   Total Bilirubin 0.9  0.2 - 1.2 mg/dL   Alkaline Phosphatase 71  39 - 117 U/L   AST 22  0 - 37 U/L   ALT 15  0 - 53 U/L   Total Protein 6.6  6.0 - 8.3 g/dL   Albumin 4.1  3.5 - 5.2 g/dL   Calcium 9.1  8.4 - 95.6 mg/dL   GFR 21.30  >86.57 mL/min  TSH      Result Value Ref Range   TSH 1.83  0.35 - 4.50 uIU/mL      Assessment & Plan:   Problem List Items Addressed This Visit   Onychomycosis     Start terbinafine 250mg  daily for 40mo, start selenium sulfide lotion 2.5%. Reassess next visit.    Relevant Medications      terbinafine (LAMISIL) 250 MG tablet   Medicare annual wellness visit, subsequent - Primary     I have personally reviewed the Medicare Annual Wellness questionnaire and have noted 1. The patient's medical and social history 2. Their use of alcohol, tobacco or illicit drugs 3. Their current medications and supplements 4. The patient's functional ability including ADL's, fall risks, home safety risks and hearing or visual impairment. 5. Diet and physical activity 6. Evidence for depression or mood disorders The patients weight, height, BMI have been recorded in the chart.  Hearing and vision has been addressed. I have made referrals, counseling and provided education to the patient based review of the above and I have provided the pt with  a written personalized care plan for preventive services. Provider list updated - see scanned questionairre. Advanced directive - Does not want life support. Would want daughters to be HCPOA.   Reviewed preventative protocols and updated unless pt declined.    Imbalance     Persistent imbalance with low normal B12. rec start b12 oral daily, if persistent dizziness will merit further blood work next visit. Normal monofilament testing today, denies paresthesias.    Hypertension     Chronic, stable. Continue amlodipine 5mg  daily.  Relevant Medications      aspirin (ASPIRIN EC) 81 MG EC tablet      amLODIpine (NORVASC) tablet       Follow up plan: Return in about 3 months (around 03/23/2014), or as needed, for follow up visit.

## 2013-12-21 NOTE — Assessment & Plan Note (Signed)
Persistent imbalance with low normal B12. rec start b12 oral daily, if persistent dizziness will merit further blood work next visit. Normal monofilament testing today, denies paresthesias.

## 2013-12-21 NOTE — Progress Notes (Signed)
Pre visit review using our clinic review tool, if applicable. No additional management support is needed unless otherwise documented below in the visit note. 

## 2013-12-21 NOTE — Assessment & Plan Note (Signed)
Chronic, stable. Continue amlodipine 5mg daily. 

## 2013-12-21 NOTE — Patient Instructions (Signed)
Let's start enteric coated 81mg  aspirin a few times a week Start vitamin b12 1000mcg daily to see if this will help balance. I've sent in terbinafine and selenium sulfide lotion to your pharmacy. Return to see me in 3-4 months for follow up balance.

## 2014-02-22 ENCOUNTER — Other Ambulatory Visit: Payer: Self-pay | Admitting: Family Medicine

## 2014-02-22 DIAGNOSIS — B351 Tinea unguium: Secondary | ICD-10-CM

## 2014-02-22 NOTE — Telephone Encounter (Signed)
Electronic Rx request received for terbinafine. Patient last seen 12/21/13 and medication filled 12/21/13. Rx written for 2 month supply. Please advise

## 2014-02-22 NOTE — Telephone Encounter (Signed)
Refilled but plz notify patient - if takes another 2 mo course I recommend come in for LFTs. ordered.

## 2014-02-23 NOTE — Telephone Encounter (Signed)
Pt advised, he scheduled appt for labs on 02/25/14.

## 2014-02-25 ENCOUNTER — Other Ambulatory Visit (INDEPENDENT_AMBULATORY_CARE_PROVIDER_SITE_OTHER): Payer: Medicare PPO

## 2014-02-25 ENCOUNTER — Encounter: Payer: Self-pay | Admitting: *Deleted

## 2014-02-25 DIAGNOSIS — B351 Tinea unguium: Secondary | ICD-10-CM

## 2014-02-25 LAB — HEPATIC FUNCTION PANEL
ALBUMIN: 4.1 g/dL (ref 3.5–5.2)
ALT: 13 U/L (ref 0–53)
AST: 20 U/L (ref 0–37)
Alkaline Phosphatase: 73 U/L (ref 39–117)
BILIRUBIN DIRECT: 0.1 mg/dL (ref 0.0–0.3)
TOTAL PROTEIN: 7 g/dL (ref 6.0–8.3)
Total Bilirubin: 0.9 mg/dL (ref 0.2–1.2)

## 2014-04-21 ENCOUNTER — Other Ambulatory Visit: Payer: Self-pay | Admitting: *Deleted

## 2014-04-21 MED ORDER — AMLODIPINE BESYLATE 5 MG PO TABS: 5.0000 mg | ORAL_TABLET | Freq: Every day | ORAL | Status: DC

## 2014-04-23 ENCOUNTER — Other Ambulatory Visit: Payer: Self-pay | Admitting: *Deleted

## 2014-04-26 ENCOUNTER — Encounter: Payer: Self-pay | Admitting: Family Medicine

## 2014-04-26 ENCOUNTER — Ambulatory Visit (INDEPENDENT_AMBULATORY_CARE_PROVIDER_SITE_OTHER): Payer: Medicare PPO | Admitting: Family Medicine

## 2014-04-26 VITALS — BP 146/80 | HR 76 | Temp 97.5°F | Wt 250.8 lb

## 2014-04-26 DIAGNOSIS — E538 Deficiency of other specified B group vitamins: Secondary | ICD-10-CM

## 2014-04-26 DIAGNOSIS — R2689 Other abnormalities of gait and mobility: Secondary | ICD-10-CM

## 2014-04-26 DIAGNOSIS — B351 Tinea unguium: Secondary | ICD-10-CM

## 2014-04-26 LAB — CBC WITH DIFFERENTIAL/PLATELET
BASOS ABS: 0 10*3/uL (ref 0.0–0.1)
Basophils Relative: 0.4 % (ref 0.0–3.0)
EOS ABS: 0.2 10*3/uL (ref 0.0–0.7)
Eosinophils Relative: 2.1 % (ref 0.0–5.0)
HCT: 43.9 % (ref 39.0–52.0)
Hemoglobin: 14.7 g/dL (ref 13.0–17.0)
Lymphocytes Relative: 19.6 % (ref 12.0–46.0)
Lymphs Abs: 1.6 10*3/uL (ref 0.7–4.0)
MCHC: 33.5 g/dL (ref 30.0–36.0)
MCV: 87.9 fl (ref 78.0–100.0)
MONOS PCT: 9.7 % (ref 3.0–12.0)
Monocytes Absolute: 0.8 10*3/uL (ref 0.1–1.0)
NEUTROS ABS: 5.5 10*3/uL (ref 1.4–7.7)
NEUTROS PCT: 68.2 % (ref 43.0–77.0)
Platelets: 155 10*3/uL (ref 150.0–400.0)
RBC: 4.99 Mil/uL (ref 4.22–5.81)
RDW: 15.2 % (ref 11.5–15.5)
WBC: 8.1 10*3/uL (ref 4.0–10.5)

## 2014-04-26 LAB — VITAMIN B12: Vitamin B-12: 561 pg/mL (ref 211–911)

## 2014-04-26 LAB — FOLATE: Folate: 14.7 ng/mL (ref >5.9)

## 2014-04-26 MED ORDER — CYANOCOBALAMIN 1000 MCG/ML IJ SOLN
1000.0000 ug | Freq: Once | INTRAMUSCULAR | Status: AC
  Administered 2014-04-26: 1000 ug via INTRAMUSCULAR

## 2014-04-26 NOTE — Progress Notes (Signed)
BP 146/80 mmHg  Pulse 76  Temp(Src) 97.5 F (36.4 C) (Oral)  Wt 250 lb 12 oz (113.739 kg)   CC: 4 mo f/u visit  Subjective:    Patient ID: Shane Prince, male    DOB: 10-04-1926, 80 y.o.   MRN: [id number]  HPI: Shane PennerRobert W Prince is a 80 y.o. male presenting on 04/26/2014 for Follow-up   Persistent trouble with balance ongoing over last 1+ year. Describes difficulty walking a straight line, as well as easily losing balance. No falls. Has cane but doesn't use. Started after first hip replacement, worsened after 2nd replacement. Last visit we found B12 level borderline low and I asked him to start oral b12 1000mcg daily.  No dizziness. No headaches. No fevers. No tremor, no memory trouble, no paresthesias. Monofilament testing previously normal. Lab Results  Component Value Date   VITAMINB12 281 12/14/2013    Onychomycosis - toenails cleared up.   Refuses flu shot.  Has not seen eye doctor in years.  Relevant past medical, surgical, family and social history reviewed and updated as indicated. Interim medical history since our last visit reviewed. Allergies and medications reviewed and updated.  Current Outpatient Prescriptions on File Prior to Visit  Medication Sig  . amLODipine (NORVASC) 5 MG tablet Take 1 tablet (5 mg total) by mouth daily.  Marland Kitchen. aspirin (ASPIRIN EC) 81 MG EC tablet Take 81 mg by mouth daily. Swallow whole.  . naproxen sodium (ANAPROX) 220 MG tablet Take 660 mg by mouth at bedtime.   . selenium sulfide (SELSUN) 2.5 % shampoo Apply 1 application topically daily as needed for irritation.  . vitamin B-12 (CYANOCOBALAMIN) 1000 MCG tablet Take 1 tablet (1,000 mcg total) by mouth daily.   No current facility-administered medications on file prior to visit.    Review of Systems Per HPI unless specifically indicated above     Objective:    BP 146/80 mmHg  Pulse 76  Temp(Src) 97.5 F (36.4 C) (Oral)  Wt 250 lb 12 oz (113.739 kg)  Wt Readings from Last 3  Encounters:  04/26/14 250 lb 12 oz (113.739 kg)  12/21/13 240 lb 12 oz (109.203 kg)  09/21/13 238 lb 8 oz (108.183 kg)    Physical Exam  Constitutional: He is oriented to person, place, and time. He appears well-developed and well-nourished. No distress.  HENT:  Mouth/Throat: Oropharynx is clear and moist. No oropharyngeal exudate.  Neck: Normal range of motion. Neck supple. Carotid bruit is not present. No thyromegaly present.  Cardiovascular: Normal rate, regular rhythm, normal heart sounds and intact distal pulses.   No murmur heard. Pulmonary/Chest: Effort normal and breath sounds normal. No respiratory distress. He has no wheezes. He has no rales.  Musculoskeletal: He exhibits no edema.  Lymphadenopathy:    He has no cervical adenopathy.  Neurological: He is alert and oriented to person, place, and time. He has normal strength. No sensory deficit. Coordination and gait abnormal.  Slowed, wide based gait Some dysdiadokokinesia present  Skin: Skin is warm and dry. No rash noted.  Psychiatric: He has a normal mood and affect.  Nursing note and vitals reviewed.  Results for orders placed or performed in visit on 02/25/14  Hepatic function panel  Result Value Ref Range   Total Bilirubin 0.9 0.2 - 1.2 mg/dL   Bilirubin, Direct 0.1 0.0 - 0.3 mg/dL   Alkaline Phosphatase 73 39 - 117 U/L   AST 20 0 - 37 U/L   ALT 13 0 - 53  U/L   Total Protein 7.0 6.0 - 8.3 g/dL   Albumin 4.1 3.5 - 5.2 g/dL      Assessment & Plan:   Problem List Items Addressed This Visit    Onychomycosis    Resolved after terbinafine.    Imbalance - Primary    Persistent ongoing imbalance, progressively worsening after each hip replacement Borderline low b12 levels - recheck confirmatory testing today. Pt has not noticed difference with oral b12 supplementation Will give IM b12 1000mcg today x1 (after blood drawn). Consider brain imaging pending results of labs today vs referral to neurology.  Pt agrees  with plan. rec cane use, rec he schedule eye xam.    Relevant Orders      CBC with Differential      Vitamin B12      Folate    Other Visit Diagnoses    B12 deficiency        Relevant Orders       CBC with Differential       Vitamin B12       Folate       Homocysteine       Methylmalonic acid, serum        Follow up plan: Return in about 6 months (around 10/25/2014), or as needed, for follow up visit.

## 2014-04-26 NOTE — Progress Notes (Signed)
Pre visit review using our clinic review tool, if applicable. No additional management support is needed unless otherwise documented below in the visit note. 

## 2014-04-26 NOTE — Patient Instructions (Addendum)
Blood work today. Then b12 shot today. Depending on blood work we may get brain imaging or refer you to a neurologist. Consider using cane. Schedule appointment with eye doctor for a checkup as you're due. Good to see you today, call us with quesitons. Return to see me in 6 months.

## 2014-04-26 NOTE — Addendum Note (Signed)
Addended by: Josph MachoANCE, KIMBERLY A on: 04/26/2014 10:08 AM   Modules accepted: Orders

## 2014-04-26 NOTE — Assessment & Plan Note (Addendum)
Persistent ongoing imbalance, progressively worsening after each hip replacement Borderline low b12 levels - recheck confirmatory testing today. Pt has not noticed difference with oral b12 supplementation Will give IM b12 today x1 (after blood drawn). Consider brain imaging pending results of labs today vs referral to neurology.  Pt agrees with plan. rec cane use, rec he schedule eye xam.

## 2014-04-26 NOTE — Assessment & Plan Note (Signed)
Resolved after terbinafine.

## 2014-04-27 LAB — HOMOCYSTEINE: Homocysteine: 15.4 umol/L (ref 4.0–15.4)

## 2014-04-29 ENCOUNTER — Other Ambulatory Visit: Payer: Self-pay | Admitting: Family Medicine

## 2014-04-29 DIAGNOSIS — R2689 Other abnormalities of gait and mobility: Secondary | ICD-10-CM

## 2014-04-29 LAB — METHYLMALONIC ACID, SERUM: Methylmalonic Acid, Quant: 100 nmol/L (ref 87–318)

## 2014-05-05 ENCOUNTER — Other Ambulatory Visit: Payer: Self-pay | Admitting: *Deleted

## 2014-05-05 MED ORDER — AMLODIPINE BESYLATE 5 MG PO TABS: 5.0000 mg | ORAL_TABLET | Freq: Every day | ORAL | Status: DC

## 2014-05-10 ENCOUNTER — Encounter: Payer: Self-pay | Admitting: Family Medicine

## 2014-05-10 DIAGNOSIS — G629 Polyneuropathy, unspecified: Secondary | ICD-10-CM | POA: Insufficient documentation

## 2014-06-03 ENCOUNTER — Ambulatory Visit: Payer: Self-pay | Admitting: Pain Medicine

## 2014-06-24 DIAGNOSIS — M4696 Unspecified inflammatory spondylopathy, lumbar region: Secondary | ICD-10-CM | POA: Diagnosis not present

## 2014-06-24 DIAGNOSIS — M545 Low back pain: Secondary | ICD-10-CM | POA: Diagnosis not present

## 2014-06-24 DIAGNOSIS — M5387 Other specified dorsopathies, lumbosacral region: Secondary | ICD-10-CM | POA: Diagnosis not present

## 2014-06-24 DIAGNOSIS — M488X7 Other specified spondylopathies, lumbosacral region: Secondary | ICD-10-CM | POA: Diagnosis not present

## 2014-06-24 DIAGNOSIS — M488X6 Other specified spondylopathies, lumbar region: Secondary | ICD-10-CM | POA: Diagnosis not present

## 2014-06-24 DIAGNOSIS — M4697 Unspecified inflammatory spondylopathy, lumbosacral region: Secondary | ICD-10-CM | POA: Diagnosis not present

## 2014-08-20 ENCOUNTER — Telehealth: Payer: Self-pay | Admitting: Family Medicine

## 2014-08-27 NOTE — Telephone Encounter (Signed)
Pt called asking why this rx was denied. I explained he needed and ov first. Pt stated he has his 6 mo fu scheduled for 10/26/14. Please advise and call pt today.

## 2014-08-27 NOTE — Telephone Encounter (Signed)
Message left advising patient that labs needed to be done for liver function for refill and at last visit toenails were cleared up. Advised to call if he had any other questions.

## 2014-09-27 ENCOUNTER — Other Ambulatory Visit: Payer: Self-pay | Admitting: Pain Medicine

## 2014-09-27 DIAGNOSIS — M545 Low back pain: Secondary | ICD-10-CM

## 2014-10-05 ENCOUNTER — Ambulatory Visit: Payer: Medicare PPO

## 2014-10-26 ENCOUNTER — Ambulatory Visit: Payer: Medicare PPO | Admitting: Family Medicine

## 2015-01-31 DIAGNOSIS — R030 Elevated blood-pressure reading, without diagnosis of hypertension: Secondary | ICD-10-CM | POA: Diagnosis not present

## 2015-01-31 DIAGNOSIS — Z6831 Body mass index (BMI) 31.0-31.9, adult: Secondary | ICD-10-CM | POA: Diagnosis not present

## 2015-01-31 DIAGNOSIS — M1990 Unspecified osteoarthritis, unspecified site: Secondary | ICD-10-CM | POA: Diagnosis not present

## 2015-01-31 DIAGNOSIS — Z96643 Presence of artificial hip joint, bilateral: Secondary | ICD-10-CM | POA: Diagnosis not present

## 2015-01-31 DIAGNOSIS — H9190 Unspecified hearing loss, unspecified ear: Secondary | ICD-10-CM | POA: Diagnosis not present

## 2015-03-03 ENCOUNTER — Other Ambulatory Visit: Payer: Self-pay | Admitting: Family Medicine

## 2015-05-18 DIAGNOSIS — Z961 Presence of intraocular lens: Secondary | ICD-10-CM | POA: Diagnosis not present

## 2015-05-18 DIAGNOSIS — I10 Essential (primary) hypertension: Secondary | ICD-10-CM | POA: Diagnosis not present

## 2015-05-18 DIAGNOSIS — H16221 Keratoconjunctivitis sicca, not specified as Sjogren's, right eye: Secondary | ICD-10-CM | POA: Diagnosis not present

## 2015-05-18 DIAGNOSIS — H16222 Keratoconjunctivitis sicca, not specified as Sjogren's, left eye: Secondary | ICD-10-CM | POA: Diagnosis not present

## 2015-08-23 DIAGNOSIS — Z9181 History of falling: Secondary | ICD-10-CM | POA: Diagnosis not present

## 2015-08-23 DIAGNOSIS — Z1389 Encounter for screening for other disorder: Secondary | ICD-10-CM | POA: Diagnosis not present

## 2015-08-23 DIAGNOSIS — M159 Polyosteoarthritis, unspecified: Secondary | ICD-10-CM | POA: Diagnosis not present

## 2015-08-23 DIAGNOSIS — I10 Essential (primary) hypertension: Secondary | ICD-10-CM | POA: Diagnosis not present

## 2015-08-23 DIAGNOSIS — L570 Actinic keratosis: Secondary | ICD-10-CM | POA: Diagnosis not present

## 2015-10-21 DIAGNOSIS — M545 Low back pain: Secondary | ICD-10-CM | POA: Diagnosis not present

## 2015-10-21 DIAGNOSIS — Z6831 Body mass index (BMI) 31.0-31.9, adult: Secondary | ICD-10-CM | POA: Diagnosis not present

## 2015-10-21 DIAGNOSIS — L570 Actinic keratosis: Secondary | ICD-10-CM | POA: Diagnosis not present

## 2015-10-21 DIAGNOSIS — H9190 Unspecified hearing loss, unspecified ear: Secondary | ICD-10-CM | POA: Diagnosis not present

## 2015-10-21 DIAGNOSIS — I10 Essential (primary) hypertension: Secondary | ICD-10-CM | POA: Diagnosis not present

## 2015-10-21 DIAGNOSIS — M1710 Unilateral primary osteoarthritis, unspecified knee: Secondary | ICD-10-CM | POA: Diagnosis not present

## 2015-10-21 DIAGNOSIS — E669 Obesity, unspecified: Secondary | ICD-10-CM | POA: Diagnosis not present

## 2016-03-22 DIAGNOSIS — E785 Hyperlipidemia, unspecified: Secondary | ICD-10-CM | POA: Diagnosis not present

## 2016-03-22 DIAGNOSIS — N401 Enlarged prostate with lower urinary tract symptoms: Secondary | ICD-10-CM | POA: Diagnosis not present

## 2016-03-22 DIAGNOSIS — Z125 Encounter for screening for malignant neoplasm of prostate: Secondary | ICD-10-CM | POA: Diagnosis not present

## 2016-03-22 DIAGNOSIS — B353 Tinea pedis: Secondary | ICD-10-CM | POA: Diagnosis not present

## 2016-03-23 DIAGNOSIS — B353 Tinea pedis: Secondary | ICD-10-CM | POA: Diagnosis not present

## 2016-03-23 DIAGNOSIS — Z125 Encounter for screening for malignant neoplasm of prostate: Secondary | ICD-10-CM | POA: Diagnosis not present

## 2016-03-23 DIAGNOSIS — E785 Hyperlipidemia, unspecified: Secondary | ICD-10-CM | POA: Diagnosis not present

## 2016-07-26 ENCOUNTER — Telehealth: Payer: Self-pay | Admitting: General Practice

## 2016-07-26 NOTE — Telephone Encounter (Signed)
Spoke to pt to schedule awv. Pt [place]  Dr. Sharen HonesGutierrez removed as PCP in Epic

## 2016-09-20 DIAGNOSIS — Z6832 Body mass index (BMI) 32.0-32.9, adult: Secondary | ICD-10-CM | POA: Diagnosis not present

## 2016-09-20 DIAGNOSIS — Z9181 History of falling: Secondary | ICD-10-CM | POA: Diagnosis not present

## 2016-09-20 DIAGNOSIS — Z1389 Encounter for screening for other disorder: Secondary | ICD-10-CM | POA: Diagnosis not present

## 2016-09-20 DIAGNOSIS — N401 Enlarged prostate with lower urinary tract symptoms: Secondary | ICD-10-CM | POA: Diagnosis not present

## 2016-09-20 DIAGNOSIS — I10 Essential (primary) hypertension: Secondary | ICD-10-CM | POA: Diagnosis not present

## 2016-10-18 DIAGNOSIS — E669 Obesity, unspecified: Secondary | ICD-10-CM | POA: Diagnosis not present

## 2016-10-18 DIAGNOSIS — M170 Bilateral primary osteoarthritis of knee: Secondary | ICD-10-CM | POA: Diagnosis not present

## 2016-11-19 DIAGNOSIS — M542 Cervicalgia: Secondary | ICD-10-CM | POA: Diagnosis not present

## 2016-11-19 DIAGNOSIS — M545 Low back pain: Secondary | ICD-10-CM | POA: Diagnosis not present

## 2016-11-21 DIAGNOSIS — M25511 Pain in right shoulder: Secondary | ICD-10-CM | POA: Diagnosis not present

## 2016-11-21 DIAGNOSIS — M542 Cervicalgia: Secondary | ICD-10-CM | POA: Diagnosis not present

## 2016-12-05 DIAGNOSIS — M47816 Spondylosis without myelopathy or radiculopathy, lumbar region: Secondary | ICD-10-CM | POA: Diagnosis not present

## 2016-12-05 DIAGNOSIS — M47812 Spondylosis without myelopathy or radiculopathy, cervical region: Secondary | ICD-10-CM | POA: Diagnosis not present

## 2016-12-20 DIAGNOSIS — M25562 Pain in left knee: Secondary | ICD-10-CM | POA: Diagnosis not present

## 2016-12-20 DIAGNOSIS — M47816 Spondylosis without myelopathy or radiculopathy, lumbar region: Secondary | ICD-10-CM | POA: Diagnosis not present

## 2017-01-15 DIAGNOSIS — M47812 Spondylosis without myelopathy or radiculopathy, cervical region: Secondary | ICD-10-CM | POA: Diagnosis not present

## 2017-06-27 DIAGNOSIS — N401 Enlarged prostate with lower urinary tract symptoms: Secondary | ICD-10-CM | POA: Diagnosis not present

## 2017-06-27 DIAGNOSIS — I10 Essential (primary) hypertension: Secondary | ICD-10-CM | POA: Diagnosis not present

## 2017-06-27 DIAGNOSIS — Z6830 Body mass index (BMI) 30.0-30.9, adult: Secondary | ICD-10-CM | POA: Diagnosis not present

## 2017-06-27 DIAGNOSIS — K5900 Constipation, unspecified: Secondary | ICD-10-CM | POA: Diagnosis not present

## 2017-06-27 DIAGNOSIS — R195 Other fecal abnormalities: Secondary | ICD-10-CM | POA: Diagnosis not present

## 2017-06-27 DIAGNOSIS — M545 Low back pain: Secondary | ICD-10-CM | POA: Diagnosis not present

## 2017-07-26 DIAGNOSIS — N289 Disorder of kidney and ureter, unspecified: Secondary | ICD-10-CM | POA: Diagnosis not present

## 2017-07-26 DIAGNOSIS — D649 Anemia, unspecified: Secondary | ICD-10-CM | POA: Diagnosis not present

## 2017-07-26 DIAGNOSIS — E669 Obesity, unspecified: Secondary | ICD-10-CM | POA: Diagnosis not present

## 2017-07-26 DIAGNOSIS — R413 Other amnesia: Secondary | ICD-10-CM | POA: Diagnosis not present

## 2017-07-26 DIAGNOSIS — Z0000 Encounter for general adult medical examination without abnormal findings: Secondary | ICD-10-CM | POA: Diagnosis not present

## 2017-07-26 DIAGNOSIS — I10 Essential (primary) hypertension: Secondary | ICD-10-CM | POA: Diagnosis not present

## 2017-07-26 DIAGNOSIS — M545 Low back pain: Secondary | ICD-10-CM | POA: Diagnosis not present

## 2017-07-26 DIAGNOSIS — Z125 Encounter for screening for malignant neoplasm of prostate: Secondary | ICD-10-CM | POA: Diagnosis not present

## 2017-07-26 DIAGNOSIS — E785 Hyperlipidemia, unspecified: Secondary | ICD-10-CM | POA: Diagnosis not present

## 2017-07-26 DIAGNOSIS — R195 Other fecal abnormalities: Secondary | ICD-10-CM | POA: Diagnosis not present

## 2017-07-26 DIAGNOSIS — Z136 Encounter for screening for cardiovascular disorders: Secondary | ICD-10-CM | POA: Diagnosis not present

## 2017-08-16 DIAGNOSIS — M19012 Primary osteoarthritis, left shoulder: Secondary | ICD-10-CM | POA: Diagnosis not present

## 2017-08-16 DIAGNOSIS — M5136 Other intervertebral disc degeneration, lumbar region: Secondary | ICD-10-CM | POA: Diagnosis not present

## 2017-08-16 DIAGNOSIS — M545 Low back pain: Secondary | ICD-10-CM | POA: Diagnosis not present

## 2017-08-16 DIAGNOSIS — M549 Dorsalgia, unspecified: Secondary | ICD-10-CM | POA: Diagnosis not present

## 2017-08-23 ENCOUNTER — Emergency Department (HOSPITAL_COMMUNITY): Payer: Medicare PPO

## 2017-08-23 ENCOUNTER — Inpatient Hospital Stay (HOSPITAL_COMMUNITY)
Admission: EM | Admit: 2017-08-23 | Discharge: 2017-08-25 | DRG: 065 | Disposition: A | Discharge status: Home Health | Payer: Medicare PPO | Attending: Internal Medicine | Admitting: Internal Medicine

## 2017-08-23 ENCOUNTER — Other Ambulatory Visit: Payer: Self-pay

## 2017-08-23 ENCOUNTER — Encounter (HOSPITAL_COMMUNITY): Payer: Self-pay | Admitting: Emergency Medicine

## 2017-08-23 ENCOUNTER — Encounter (HOSPITAL_COMMUNITY): Payer: Self-pay | Admitting: *Deleted

## 2017-08-23 ENCOUNTER — Ambulatory Visit (HOSPITAL_COMMUNITY)
Admission: EM | Admit: 2017-08-23 | Discharge: 2017-08-23 | Disposition: A | Discharge status: Home / Self Care | Payer: Medicare PPO

## 2017-08-23 ENCOUNTER — Observation Stay (HOSPITAL_COMMUNITY): Payer: Medicare PPO

## 2017-08-23 DIAGNOSIS — D649 Anemia, unspecified: Secondary | ICD-10-CM

## 2017-08-23 DIAGNOSIS — G629 Polyneuropathy, unspecified: Secondary | ICD-10-CM | POA: Diagnosis present

## 2017-08-23 DIAGNOSIS — Z7901 Long term (current) use of anticoagulants: Secondary | ICD-10-CM | POA: Diagnosis not present

## 2017-08-23 DIAGNOSIS — E669 Obesity, unspecified: Secondary | ICD-10-CM | POA: Diagnosis present

## 2017-08-23 DIAGNOSIS — R4781 Slurred speech: Secondary | ICD-10-CM | POA: Diagnosis present

## 2017-08-23 DIAGNOSIS — R29810 Facial weakness: Secondary | ICD-10-CM | POA: Diagnosis present

## 2017-08-23 DIAGNOSIS — Z8249 Family history of ischemic heart disease and other diseases of the circulatory system: Secondary | ICD-10-CM

## 2017-08-23 DIAGNOSIS — R42 Dizziness and giddiness: Secondary | ICD-10-CM | POA: Diagnosis not present

## 2017-08-23 DIAGNOSIS — I4891 Unspecified atrial fibrillation: Secondary | ICD-10-CM | POA: Diagnosis not present

## 2017-08-23 DIAGNOSIS — E785 Hyperlipidemia, unspecified: Secondary | ICD-10-CM | POA: Diagnosis present

## 2017-08-23 DIAGNOSIS — G459 Transient cerebral ischemic attack, unspecified: Secondary | ICD-10-CM

## 2017-08-23 DIAGNOSIS — Z6830 Body mass index (BMI) 30.0-30.9, adult: Secondary | ICD-10-CM | POA: Diagnosis not present

## 2017-08-23 DIAGNOSIS — I6529 Occlusion and stenosis of unspecified carotid artery: Secondary | ICD-10-CM | POA: Diagnosis present

## 2017-08-23 DIAGNOSIS — R29702 NIHSS score 2: Secondary | ICD-10-CM | POA: Diagnosis present

## 2017-08-23 DIAGNOSIS — R29818 Other symptoms and signs involving the nervous system: Secondary | ICD-10-CM | POA: Diagnosis present

## 2017-08-23 DIAGNOSIS — I639 Cerebral infarction, unspecified: Secondary | ICD-10-CM | POA: Diagnosis present

## 2017-08-23 DIAGNOSIS — Z79899 Other long term (current) drug therapy: Secondary | ICD-10-CM

## 2017-08-23 DIAGNOSIS — I350 Nonrheumatic aortic (valve) stenosis: Secondary | ICD-10-CM | POA: Diagnosis present

## 2017-08-23 DIAGNOSIS — Z66 Do not resuscitate: Secondary | ICD-10-CM | POA: Diagnosis present

## 2017-08-23 DIAGNOSIS — I351 Nonrheumatic aortic (valve) insufficiency: Secondary | ICD-10-CM | POA: Diagnosis not present

## 2017-08-23 DIAGNOSIS — R471 Dysarthria and anarthria: Secondary | ICD-10-CM | POA: Diagnosis not present

## 2017-08-23 DIAGNOSIS — R001 Bradycardia, unspecified: Secondary | ICD-10-CM

## 2017-08-23 DIAGNOSIS — R7989 Other specified abnormal findings of blood chemistry: Secondary | ICD-10-CM | POA: Diagnosis not present

## 2017-08-23 DIAGNOSIS — I480 Paroxysmal atrial fibrillation: Secondary | ICD-10-CM | POA: Diagnosis present

## 2017-08-23 DIAGNOSIS — N179 Acute kidney failure, unspecified: Secondary | ICD-10-CM | POA: Diagnosis present

## 2017-08-23 DIAGNOSIS — N400 Enlarged prostate without lower urinary tract symptoms: Secondary | ICD-10-CM

## 2017-08-23 DIAGNOSIS — I10 Essential (primary) hypertension: Secondary | ICD-10-CM | POA: Diagnosis present

## 2017-08-23 DIAGNOSIS — I63531 Cerebral infarction due to unspecified occlusion or stenosis of right posterior cerebral artery: Secondary | ICD-10-CM | POA: Diagnosis not present

## 2017-08-23 LAB — COMPREHENSIVE METABOLIC PANEL
ALBUMIN: 3.5 g/dL (ref 3.5–5.0)
ALT: 11 U/L — ABNORMAL LOW (ref 17–63)
AST: 20 U/L (ref 15–41)
Alkaline Phosphatase: 86 U/L (ref 38–126)
Anion gap: 9 (ref 5–15)
BUN: 25 mg/dL — AB (ref 6–20)
CHLORIDE: 108 mmol/L (ref 101–111)
CO2: 22 mmol/L (ref 22–32)
Calcium: 8.8 mg/dL — ABNORMAL LOW (ref 8.9–10.3)
Creatinine, Ser: 1.74 mg/dL — ABNORMAL HIGH (ref 0.61–1.24)
GFR calc Af Amer: 38 mL/min — ABNORMAL LOW (ref >60)
GFR calc non Af Amer: 33 mL/min — ABNORMAL LOW (ref >60)
Glucose, Bld: 135 mg/dL — ABNORMAL HIGH (ref 65–99)
POTASSIUM: 4.4 mmol/L (ref 3.5–5.1)
Sodium: 139 mmol/L (ref 135–145)
Total Bilirubin: 0.9 mg/dL (ref 0.3–1.2)
Total Protein: 5.8 g/dL — ABNORMAL LOW (ref 6.5–8.1)

## 2017-08-23 LAB — CBC
HCT: 32.7 % — ABNORMAL LOW (ref 39.0–52.0)
HCT: 31 % — ABNORMAL LOW (ref 39.0–52.0)
Hemoglobin: 10.5 g/dL — ABNORMAL LOW (ref 13.0–17.0)
Hemoglobin: 10.3 g/dL — ABNORMAL LOW (ref 13.0–17.0)
MCH: 27.9 pg (ref 26.0–34.0)
MCH: 28.7 pg (ref 26.0–34.0)
MCHC: 32.1 g/dL (ref 30.0–36.0)
MCHC: 33.2 g/dL (ref 30.0–36.0)
MCV: 86.7 fL (ref 78.0–100.0)
MCV: 86.4 fL (ref 78.0–100.0)
PLATELETS: 151 10*3/uL (ref 150–400)
Platelets: 156 10*3/uL (ref 150–400)
RBC: 3.77 MIL/uL — ABNORMAL LOW (ref 4.22–5.81)
RBC: 3.59 MIL/uL — ABNORMAL LOW (ref 4.22–5.81)
RDW: 16.7 % — AB (ref 11.5–15.5)
RDW: 16.9 % — AB (ref 11.5–15.5)
WBC: 6.9 10*3/uL (ref 4.0–10.5)
WBC: 7.3 10*3/uL (ref 4.0–10.5)

## 2017-08-23 LAB — CREATININE, SERUM
Creatinine, Ser: 1.67 mg/dL — ABNORMAL HIGH (ref 0.61–1.24)
GFR calc Af Amer: 40 mL/min — ABNORMAL LOW (ref >60)
GFR calc non Af Amer: 35 mL/min — ABNORMAL LOW (ref >60)

## 2017-08-23 LAB — I-STAT CHEM 8, ED
BUN: 24 mg/dL — ABNORMAL HIGH (ref 6–20)
CREATININE: 1.7 mg/dL — AB (ref 0.61–1.24)
Calcium, Ion: 1.19 mmol/L (ref 1.15–1.40)
Chloride: 106 mmol/L (ref 101–111)
Glucose, Bld: 134 mg/dL — ABNORMAL HIGH (ref 65–99)
HEMATOCRIT: 30 % — AB (ref 39.0–52.0)
HEMOGLOBIN: 10.2 g/dL — AB (ref 13.0–17.0)
POTASSIUM: 4.3 mmol/L (ref 3.5–5.1)
SODIUM: 140 mmol/L (ref 135–145)
TCO2: 23 mmol/L (ref 22–32)

## 2017-08-23 LAB — DIFFERENTIAL
BASOS ABS: 0 10*3/uL (ref 0.0–0.1)
BASOS PCT: 0 %
EOS ABS: 0.1 10*3/uL (ref 0.0–0.7)
Eosinophils Relative: 2 %
Lymphocytes Relative: 16 %
Lymphs Abs: 1.1 10*3/uL (ref 0.7–4.0)
MONOS PCT: 6 %
Monocytes Absolute: 0.4 10*3/uL (ref 0.1–1.0)
NEUTROS PCT: 76 %
Neutro Abs: 5.2 10*3/uL (ref 1.7–7.7)

## 2017-08-23 LAB — I-STAT TROPONIN, ED: TROPONIN I, POC: 0.01 ng/mL (ref 0.00–0.08)

## 2017-08-23 LAB — TSH: TSH: 1.736 u[IU]/mL (ref 0.350–4.500)

## 2017-08-23 LAB — PROTIME-INR
INR: 1.19
Prothrombin Time: 15 seconds (ref 11.4–15.2)

## 2017-08-23 LAB — APTT: APTT: 31 s (ref 24–36)

## 2017-08-23 MED ORDER — TAMSULOSIN HCL 0.4 MG PO CAPS
0.8000 mg | ORAL_CAPSULE | Freq: Every day | ORAL | Status: DC
  Administered 2017-08-23 – 2017-08-24 (×2): 0.8 mg via ORAL
  Filled 2017-08-23 (×2): qty 2

## 2017-08-23 MED ORDER — TIZANIDINE HCL 4 MG PO TABS
4.0000 mg | ORAL_TABLET | Freq: Four times a day (QID) | ORAL | Status: DC | PRN
  Filled 2017-08-23: qty 1

## 2017-08-23 MED ORDER — VITAMIN B-1 100 MG PO TABS
100.0000 mg | ORAL_TABLET | Freq: Every day | ORAL | Status: DC
  Administered 2017-08-24 – 2017-08-25 (×2): 100 mg via ORAL
  Filled 2017-08-23 (×2): qty 1

## 2017-08-23 MED ORDER — FOLIC ACID 1 MG PO TABS
1.0000 mg | ORAL_TABLET | Freq: Every day | ORAL | Status: DC
  Administered 2017-08-24 – 2017-08-25 (×2): 1 mg via ORAL
  Filled 2017-08-23 (×2): qty 1

## 2017-08-23 MED ORDER — ACETAMINOPHEN 650 MG RE SUPP: 650.0000 mg | Freq: Four times a day (QID) | RECTAL | Status: DC | PRN

## 2017-08-23 MED ORDER — ACETAMINOPHEN 325 MG PO TABS: 650.0000 mg | ORAL_TABLET | Freq: Four times a day (QID) | ORAL | Status: DC | PRN

## 2017-08-23 MED ORDER — ASPIRIN 325 MG PO TABS
325.0000 mg | ORAL_TABLET | Freq: Every day | ORAL | Status: DC
  Administered 2017-08-23 – 2017-08-25 (×3): 325 mg via ORAL
  Filled 2017-08-23 (×3): qty 1

## 2017-08-23 MED ORDER — HEPARIN SODIUM (PORCINE) 5000 UNIT/ML IJ SOLN
5000.0000 [IU] | Freq: Three times a day (TID) | INTRAMUSCULAR | Status: DC
  Administered 2017-08-23 – 2017-08-24 (×2): 5000 [IU] via SUBCUTANEOUS
  Filled 2017-08-23 (×2): qty 1

## 2017-08-23 MED ORDER — STROKE: EARLY STAGES OF RECOVERY BOOK
Freq: Once | Status: AC
  Administered 2017-08-23: 15:00:00
  Filled 2017-08-23: qty 1

## 2017-08-23 MED ORDER — FINASTERIDE 5 MG PO TABS
5.0000 mg | ORAL_TABLET | Freq: Every day | ORAL | Status: DC
  Administered 2017-08-23 – 2017-08-24 (×2): 5 mg via ORAL
  Filled 2017-08-23 (×3): qty 1

## 2017-08-23 MED ORDER — SENNOSIDES-DOCUSATE SODIUM 8.6-50 MG PO TABS: 1.0000 | ORAL_TABLET | Freq: Every evening | ORAL | Status: DC | PRN

## 2017-08-23 MED ORDER — THIAMINE HCL 100 MG/ML IJ SOLN: 100.0000 mg | Freq: Every day | INTRAMUSCULAR | Status: DC

## 2017-08-23 MED ORDER — SODIUM CHLORIDE 0.9 % IV SOLN: INTRAVENOUS | Status: AC

## 2017-08-23 MED ORDER — ONDANSETRON HCL 4 MG/2ML IJ SOLN: 4.0000 mg | Freq: Four times a day (QID) | INTRAMUSCULAR | Status: DC | PRN

## 2017-08-23 MED ORDER — ATORVASTATIN CALCIUM 80 MG PO TABS
80.0000 mg | ORAL_TABLET | Freq: Every day | ORAL | Status: DC
  Administered 2017-08-23 – 2017-08-24 (×2): 80 mg via ORAL
  Filled 2017-08-23 (×2): qty 1

## 2017-08-23 MED ORDER — SODIUM CHLORIDE 0.9 % IV SOLN
INTRAVENOUS | Status: DC
  Administered 2017-08-23: 17:00:00 via INTRAVENOUS

## 2017-08-23 MED ORDER — ONDANSETRON HCL 4 MG PO TABS: 4.0000 mg | ORAL_TABLET | Freq: Four times a day (QID) | ORAL | Status: DC | PRN

## 2017-08-23 MED ORDER — ADULT MULTIVITAMIN W/MINERALS CH
1.0000 | ORAL_TABLET | Freq: Every day | ORAL | Status: DC
  Administered 2017-08-24 – 2017-08-25 (×2): 1 via ORAL
  Filled 2017-08-23 (×2): qty 1

## 2017-08-23 NOTE — Code Documentation (Signed)
84 yo male coming from home with grandson with complaints of sudden onset of slurred speech, facial droop, and dizziness. Family noted the change at 0945, but patient reports that he noticed the dizziness at 0830. Pt woke up at 0300 normal with no symptoms. Pt has hx of HTN. Came in triage POV and Code Stroke called. Stroke Team activated and met in CT. Ct negative. Pt alert and oriented x4 upon arrival with slight left facial droop noted and some slurred speech. No other deficits noted at this time. Too mild to treat and in the window for tPA until 1300. Handoff given to [facility]. Pt to be evaluated and to be Admitted to Stroke service.

## 2017-08-23 NOTE — ED Notes (Signed)
While this RN was assessing and placing an IV, pt became severely bradycardic with irregular HR as low as 46. Sustained in 50s at this time. While pt in 10040s, he stated he felt a little funny. No neuro changes at this time, will inform MD and continue to monitor. Family bedside

## 2017-08-23 NOTE — ED Provider Notes (Signed)
[facility] EMERGENCY DEPARTMENT Provider Note   CSN: [id number] Arrival date & time: 08/23/17  1126   An emergency department physician performed an initial assessment on this suspected stroke patient at 1155.  History   Chief Complaint Chief Complaint  Patient presents with  . Code Stroke    HPI Shane Prince is a 84 y.o. male.  HPI At 09 45 patient was noted to have slurred speech and left-sided facial droop.  This was noted by his sister.  Patient reports he was kind of dizzy.  No visual loss or changes.  No focal weakness numbness or tingling of the extremities.  Patient denies any chest pain, shortness of breath or syncope. Past Medical History:  Diagnosis Date  . AK (actinic keratosis) 2014   s/p efudex cream treatment  . Arthritis   . HOH (hard of hearing) left   doesn't use hearing aid  . Hypertension   . Imbalance   . Lumbago   . Peripheral neuropathy    [place]    Patient Active Problem List   Diagnosis Date Noted  . Peripheral neuropathy   . Medicare annual wellness visit, subsequent 12/21/2013  . Onychomycosis 12/21/2013  . Imbalance   . Hypertension   . AK (actinic keratosis)   . Hip arthritis 06/01/2013    Past Surgical History:  Procedure Laterality Date  . BACK SURGERY  2007   L3-4,5  . CARPAL TUNNEL RELEASE Left 2012  . CARPAL TUNNEL RELEASE  11/27/2011   Procedure: CARPAL TUNNEL RELEASE;  Surgeon: Drucilla Schmidt, MD;  Service: Orthopedics;  Laterality: Right;  . CATARACT EXTRACTION Bilateral 2008  . foot spur     toe  . KNEE ARTHROSCOPY Right 1960  . TOTAL HIP ARTHROPLASTY Right 2008  . TOTAL HIP ARTHROPLASTY Left 06/01/2013   Procedure: TOTAL HIP ARTHROPLASTY;  Surgeon: Nestor Lewandowsky, MD;  Location: [facility] OR;  Service: Orthopedics;  Laterality: Left;  . ULNAR NERVE TRANSPOSITION  11/27/2011   Procedure: ULNAR NERVE DECOMPRESSION/TRANSPOSITION;  Surgeon: Drucilla Schmidt, MD;  Location: [facility];   Service: Orthopedics;  Laterality: Right;  RIGHT ELBOW DECOMPRESSION OF THE ULNAR NERVE        Home Medications    Prior to Admission medications   Medication Sig Start Date End Date Taking? Authorizing Provider  finasteride (PROSCAR) 5 MG tablet Take 5 mg by mouth at bedtime.   Yes [provider]  losartan-hydrochlorothiazide (HYZAAR) 100-25 MG tablet Take 1 tablet by mouth at bedtime.    Yes [provider]  naproxen sodium (ANAPROX) 220 MG tablet Take 660 mg by mouth daily as needed.    Yes [provider]  tamsulosin (FLOMAX) 0.4 MG CAPS capsule Take 0.8 mg by mouth at bedtime.    Yes [provider]  tiZANidine (ZANAFLEX) 4 MG tablet Take 4 mg by mouth every 6 (six) hours as needed for muscle spasms.   Yes [provider]  amLODipine (NORVASC) 5 MG tablet Take 1 tablet (5 mg total) by mouth daily. Patient not taking: Reported on 08/23/2017 05/05/14   Eustaquio Boyden, MD  amLODipine (NORVASC) 5 MG tablet Take one tablet daily. **MUST HAVE PHYSICAL FOR FURTHER REFILLS** Patient not taking: Reported on 08/23/2017 03/03/15   Eustaquio Boyden, MD  selenium sulfide (SELSUN) 2.5 % shampoo Apply 1 application topically daily as needed for irritation. Patient not taking: Reported on 08/23/2017 12/21/13   Eustaquio Boyden, MD  vitamin B-12 (CYANOCOBALAMIN) 1000 MCG tablet Take 1  tablet (1,000 mcg total) by mouth daily. Patient not taking: Reported on 08/23/2017 12/21/13   Eustaquio Boyden, MD    Family History Family History  Problem Relation Age of Onset  . CAD Father        MI  . Cancer Mother        unsure  . Stroke Neg Hx   . Diabetes Neg Hx     Social History Social History   Tobacco Use  . Smoking status: Never Smoker  . Smokeless tobacco: Never Used  Substance Use Topics  . Alcohol use: Yes    Alcohol/week: 4.2 oz    Types: 7 Shots of liquor per week    Comment: Daily shot of vodka  . Drug use: No     Allergies   Patient  has no known allergies.   Review of Systems Review of Systems 10 Systems reviewed and are negative for acute change except as noted in the HPI.   Physical Exam Updated Vital Signs BP 115/68   Pulse 66   Resp 19   Ht 6' (1.829 m)   Wt 103.4 kg (228 lb)   SpO2 99%   BMI 30.92 kg/m   Physical Exam  Constitutional: He is oriented to person, place, and time. He appears well-developed and well-nourished. No distress.  At the time of my evaluation, patient's mental status is clear.  He answers all questions appropriately.  No respiratory distress.  HENT:  Head: Normocephalic and atraumatic.  Nose: Nose normal.  Mouth/Throat: Oropharynx is clear and moist.  Eyes: Pupils are equal, round, and reactive to light. Conjunctivae and EOM are normal.  Neck: Neck supple.  Cardiovascular: Normal rate.  No murmur heard. Irregularly irregular.  2\6 systolic ejection murmur monitor rate varies from mid 40s-70s.  No change in patient's mental status or clinical appearance.  Pulmonary/Chest: Effort normal and breath sounds normal. No respiratory distress.  Abdominal: Soft. There is no tenderness.  Musculoskeletal: Normal range of motion. He exhibits no edema or tenderness.  Neurological: He is alert and oriented to person, place, and time. No cranial nerve deficit. He exhibits normal muscle tone. Coordination normal.  No pronator drift.  Patient can elevate and hold each lower extremity off the bed independently.  Normal plantar flexion of the feet.  Mental status is clear.  Speech is situationally oriented and cognitively intact.  Skin: Skin is warm and dry.  Psychiatric: He has a normal mood and affect.  Nursing note and vitals reviewed.    ED Treatments / Results  Labs (all labs ordered are listed, but only abnormal results are displayed) Labs Reviewed  CBC - Abnormal; Notable for the following components:      Result Value   RBC 3.77 (*)    Hemoglobin 10.5 (*)    HCT 32.7 (*)    RDW  16.7 (*)    All other components within normal limits  COMPREHENSIVE METABOLIC PANEL - Abnormal; Notable for the following components:   Glucose, Bld 135 (*)    BUN 25 (*)    Creatinine, Ser 1.74 (*)    Calcium 8.8 (*)    Total Protein 5.8 (*)    ALT 11 (*)    GFR calc non Af Amer 33 (*)    GFR calc Af Amer 38 (*)    All other components within normal limits  I-STAT CHEM 8, ED - Abnormal; Notable for the following components:   BUN 24 (*)    Creatinine, Ser 1.70 (*)  Glucose, Bld 134 (*)    Hemoglobin 10.2 (*)    HCT 30.0 (*)    All other components within normal limits  PROTIME-INR  APTT  DIFFERENTIAL  I-STAT TROPONIN, ED  CBG MONITORING, ED    EKG EKG Interpretation  Date/Time:  Friday August 23 2017 12:12:55 EDT Ventricular Rate:  75 PR Interval:    QRS Duration: 149 QT Interval:  458 QTC Calculation: 512 R Axis:   -54 Text Interpretation:  Atrial fibrillation RBBB and LAFB Baseline wander in lead(s) I III aVR aVL agree. qrs morphology unchanged from previous Confirmed by Arby BarrettePfeiffer, [employer] [phone]([id number]) on 08/23/2017 12:25:22 PM   Radiology Ct Head Code Stroke Wo Contrast  Result Date: 08/23/2017 CLINICAL DATA:  Code stroke. Left-sided facial droop. Last seen normal 2 hours ago. EXAM: CT HEAD WITHOUT CONTRAST TECHNIQUE: Contiguous axial images were obtained from the base of the skull through the vertex without intravenous contrast. COMPARISON:  None. FINDINGS: Brain: Mild atrophy and white matter changes are within normal limits for age. Focal periventricular hypoattenuation within the left centrum semi of bowel is likely chronic. No focal cortical infarct is present. The basal ganglia are intact. Insular ribbon is normal bilaterally. White matter hypoattenuation is present in the midbrain and pons. The cerebellum is within normal limits. Vascular: Atherosclerotic calcifications are present within the cavernous internal carotid arteries bilaterally. There is no hyperdense vessel.  Skull: Calvarium is intact. No focal lytic or blastic lesions are present. Sinuses/Orbits: A small polyp or mucous retention cyst is noted posteriorly within the right maxillary sinus. The remaining paranasal sinuses and the mastoid air cells are clear. Bilateral lens replacements are present. Globes and orbits are otherwise within normal limits. ASPECTS [facility]([place] Stroke Program Early CT Score) - Ganglionic level infarction (caudate, lentiform nuclei, internal capsule, insula, M1-M3 cortex): 7/7 - Supraganglionic infarction (M4-M6 cortex): 3/3 Total score (0-10 with 10 being normal): 10/10 IMPRESSION: 1. Mild generalized atrophy and white matter changes are largely within normal limits for age. 2. Periventricular white matter hypoattenuation in the left centrum semi of bowel is likely chronic. 3. No acute right-sided cortical or basal ganglia infarct. 4. Heterogeneous hypoattenuation within the brainstem is nonspecific. Ischemia is not excluded. 5. ASPECTS is 10/10 The above was relayed via text pager to Dr. Lebron ConnersMARCY Areil Ottey on 08/23/2017 at 12:12 . Electronically Signed   By: Marin Robertshristopher  Mattern M.D.   On: 08/23/2017 12:13    Procedures Procedures (including critical care time)  Medications Ordered in ED Medications   stroke: mapping our early stages of recovery book (has no administration in time range)  aspirin tablet 325 mg (has no administration in time range)  atorvastatin (LIPITOR) tablet 80 mg (has no administration in time range)     Initial Impression / Assessment and Plan / ED Course  I have reviewed the triage vital signs and the nursing notes.  Pertinent labs & imaging results that were available during my care of the patient were reviewed by me and considered in my medical decision making (see chart for details).     Consult: Patient was initially treated as a code stroke.  He has been seen by neurology with recommendations for MRI, echo.  Final Clinical Impressions(s) / ED  Diagnoses   Final diagnoses:  TIA (transient ischemic attack)  New onset atrial fibrillation (HCC)  Bradycardia   Patient presents as outlined above with new onset of facial droop and slurred speech noted by family members.  This did wax and wane and is now resolved.  CT head does not show acute findings.  Plan will be to continue stroke workup per consultation with neurology.  Patient appears to have new onset atrial fibrillation.  Rates are ranging from 40s-70s.  Patient is clinically stable.  He may require cardiology consultation for bradycardia if this becomes symptomatic.  ED Discharge Orders    None       Arby Barrette, MD 08/23/17 1346

## 2017-08-23 NOTE — ED Notes (Signed)
Attempted report x1. 

## 2017-08-23 NOTE — ED Triage Notes (Signed)
Pt in from home reports dizziness and slurred speech today at 9:45, [facility], pt has L sided facial droop, A&O x4, negative VAN, Code Stroke called in triage

## 2017-08-23 NOTE — Consult Note (Addendum)
Neurology Consultation  Reason for Consult: Stroke evaluation  Referring Physician: EDP  CC: Left facial droop, slurred speech   History is obtained from patient.   HPI: Shane Prince is a 84 y.o. male with a pmhx of HTN, BPH presenting from home. Patient reports he began feeling dizzy at 8:30 this morning. At 9:45am, family witnessed sudden onset left facial droop and slurred speech. Deficits initially improved, thus family did not bring him in for evaluation. Symptoms later worsened and he was brought in to the ED as a code stroke. Symptoms on arrival were mild with NIHSS of 2. CT head was negative for bleed and code stroke was canceled. Of note, patient was in atrial fibrillation with ventricular rate of 75 while in the ED. He denied chest pain, heart palpitations, or shortness of breath. He has never had a stroke or a heart attack and denies any knowledge of prior arrhythmia.   LKW: 8:30 am  tpa given?: no  ROS: A 14 point ROS was performed and is negative except as noted in the HPI.   Past Medical History:  Diagnosis Date  . AK (actinic keratosis) 2014   s/p efudex cream treatment  . Arthritis   . HOH (hard of hearing) left   doesn't use hearing aid  . Hypertension   . Imbalance   . Lumbago   . Peripheral neuropathy    [place]   Family History  Problem Relation Age of Onset  . CAD Father        MI  . Cancer Mother        unsure  . Stroke Neg Hx   . Diabetes Neg Hx     Social History:   reports that he has never smoked. He has never used smokeless tobacco. He reports that he drinks about 4.2 oz of alcohol per week. He reports that he does not use drugs.  Medications No current facility-administered medications for this encounter.   Current Outpatient Medications:  .  amLODipine (NORVASC) 5 MG tablet, Take 1 tablet (5 mg total) by mouth daily., Disp: 90 tablet, Rfl: 2 .  amLODipine (NORVASC) 5 MG tablet, Take one tablet daily. **MUST HAVE PHYSICAL FOR FURTHER  REFILLS**, Disp: 30 tablet, Rfl: 0 .  aspirin (ASPIRIN EC) 81 MG EC tablet, Take 81 mg by mouth daily. Swallow whole., Disp: , Rfl:  .  naproxen sodium (ANAPROX) 220 MG tablet, Take 660 mg by mouth at bedtime. , Disp: , Rfl:  .  selenium sulfide (SELSUN) 2.5 % shampoo, Apply 1 application topically daily as needed for irritation., Disp: 118 mL, Rfl: 3 .  vitamin B-12 (CYANOCOBALAMIN) 1000 MCG tablet, Take 1 tablet (1,000 mcg total) by mouth daily., Disp: , Rfl:   Exam: Current vital signs: Wt 228 lb 9.9 oz (103.7 kg)   BMI 31.22 kg/m  Vital signs in last 24 hours: Weight:  [228 lb 9.9 oz (103.7 kg)] 228 lb 9.9 oz (103.7 kg) (03/29 1200)  GENERAL: Awake, alert in NAD HEENT: - Normocephalic and atraumatic, dry mm, no LN++, no Thyromegally LUNGS - Breathing comfortably on RA CV: Irregular rhythm  ABDOMEN - Soft, nontender, nondistended with normoactive BS Ext: warm, well perfused, no edema  NEURO:  Mental Status: AA&Ox3  Language: speech is mildly slurred.  Naming, repetition, fluency, and comprehension intact. Cranial Nerves: PERRL 3 mm/brisk. EOMI, visual fields full, mild left facial droop, facial sensation intact, hearing intact, tongue/uvula/soft palate midline, normal sternocleidomastoid and trapezius muscle strength. No evidence of tongue  atrophy or fibrillations Motor: 5/5 in all extremities  Tone: is normal and bulk is normal Sensation- Intact to light touch bilaterally Coordination: FTN intact bilaterally Gait- deferred  NIHSS: 2  Labs I have reviewed labs in epic and the results pertinent to this consultation are:  CBC    Component Value Date/Time   WBC 8.1 04/26/2014 1007   RBC 4.99 04/26/2014 1007   HGB 10.2 (L) 08/23/2017 1203   HCT 30.0 (L) 08/23/2017 1203   PLT 155.0 04/26/2014 1007   MCV 87.9 04/26/2014 1007   MCH 30.4 06/04/2013 0401   MCHC 33.5 04/26/2014 1007   RDW 15.2 04/26/2014 1007   LYMPHSABS 1.6 04/26/2014 1007   MONOABS 0.8 04/26/2014 1007    EOSABS 0.2 04/26/2014 1007   BASOSABS 0.0 04/26/2014 1007    CMP     Component Value Date/Time   NA 140 08/23/2017 1203   K 4.3 08/23/2017 1203   CL 106 08/23/2017 1203   CO2 27 12/14/2013 0846   GLUCOSE 134 (H) 08/23/2017 1203   BUN 24 (H) 08/23/2017 1203   CREATININE 1.70 (H) 08/23/2017 1203   CALCIUM 9.1 12/14/2013 0846   PROT 7.0 02/25/2014 0827   ALBUMIN 4.1 02/25/2014 0827   AST 20 02/25/2014 0827   ALT 13 02/25/2014 0827   ALKPHOS 73 02/25/2014 0827   BILITOT 0.9 02/25/2014 0827   GFRNONAA 66 (L) 06/02/2013 0620   GFRAA 77 (L) 06/02/2013 0620    Lipid Panel     Component Value Date/Time   CHOL 178 12/14/2013 0846   TRIG 102.0 12/14/2013 0846   HDL 42.40 12/14/2013 0846   CHOLHDL 4 12/14/2013 0846   VLDL 20.4 12/14/2013 0846   LDLCALC 115 (H) 12/14/2013 0846    Imaging I have reviewed the images obtained:  CT-scan of the brain: IMPRESSION: 1. Mild generalized atrophy and white matter changes are largely within normal limits for age. 2. Periventricular white matter hypoattenuation in the left centrum semi of bowel is likely chronic. 3. No acute right-sided cortical or basal ganglia infarct. 4. Heterogeneous hypoattenuation within the brainstem is nonspecific. Ischemia is not excluded. 5. ASPECTS is 10/10  The above was relayed via text pager to Dr. Lebron ConnersMARCY PFEIFFER on 08/23/2017 at 12:12 .  MRI examination of the brain - pending   Assessment:  84 yo M with pmhx of HTN and BPH presenting with acute onset left facial droop and slurred speech witnessed by family. Last known normal 8:30 am. CT head was negative for bleed. Patient was noted to be in new onset atrial fibrillation on arrival.   Acute ischemic stroke/TIA  Etiology : likeley new onset Afib  Recommendations: -- Admit to hospitalist  -- MRI brain w/o contrast  -- Will need atrial fibrillation work up, and eventually long term anticoagulation  -- Frequent neuro checks -- ASA 325 daily --  Atorvastatin 80 -- Echocardiogram -- Carotid dopplers  -- A1c, lipid panel  -- SLP, PT/OT -- Tele monitoring  -- BP goals: Permissive HTN upto 180/110 mm HG ( lower permissive parameters)  Reymundo Pollarolyn Guilloud, M.D. - PGY2 08/23/2017, 12:10 PM   NEUROHOSPITALIST ADDENDUM Seen and examined the patient today. I reviewed and agree with history, exam as mentioned above and Formulated plan as documented above by PAC/Resident. Few edits  made as needed.    Will follow.  Georgiana SpinnerSushanth Aroor MD [facility] [phone]  If 7pm to 7am, please call on call as listed on AMION.

## 2017-08-23 NOTE — H&P (Addendum)
Date: 08/23/2017               Patient Name:  Shane Prince MRN: [id number]  DOB: 1926/12/09 Age / Sex: 84 y.o., male   PCP: No primary care provider on file.         Medical Service: Internal Medicine Teaching Service         Attending Physician: Dr. Mikey Bussing, Marthenia Rolling, DO    First Contact: Dr. Minda Meo Pager: [phone]  Second Contact: Dr. Vincente Liberty Pager: [phone]       After Hours (After 5p/  First Contact Pager: [phone]  weekends / holidays): Second Contact Pager: [phone]   Chief Complaint: slurred speech and facial droop   History of Present Illness:  84 yo male with PMH of HTN, BPH, and osteoarthritis presenting with chief complaint of left sided facial droop and slurred speech. He states he felt dizzy at  around 8:30 this morning. At around 9:45am, his family witnessed sudden onset left facial droop and slurred speech. Per daughter his speech was unintelligible. The patient himself did not notice the facial droop or slurred speech. He denies focal extremity weakness, changes in vision, or changes in sensation. He has never experienced these symptoms before and has no stroke history. Patient states he has "issues" with balance and cannot walk in a straight line, but this is chronic and stable. His symptoms have since improved.   He denies chest pain, shortness of breath, palpitations, he has no history of atrial fibrillation, denies lower extremity swelling/pain. He denies recent episodes of LOC or syncope, but does state he passed out about one year ago and the other time was 15 years ago. His daughter states that they though his episode of syncope last year was due to dehydration.   He denies recent infection, nausea, vomiting, fevers, chills, diarrhea, dysuria, or hematuria.   ED Course: Vital signs: BP 115/68, pulse 66, resp. rate 19, SpO2 99 % on RA. Labs: Cr 1.74, BUN 24; Hgb 10.5, MCV 86.7 Meds: ASA 325 mg Imaging: CT head negative for hemorrhage; No acute right-sided  cortical or basal ganglia infarct.  Meds:  Current Meds  Medication Sig  . finasteride (PROSCAR) 5 MG tablet Take 5 mg by mouth at bedtime.  Marland Kitchen losartan-hydrochlorothiazide (HYZAAR) 100-25 MG tablet Take 1 tablet by mouth at bedtime.   . naproxen sodium (ANAPROX) 220 MG tablet Take 660 mg by mouth daily as needed.   . tamsulosin (FLOMAX) 0.4 MG CAPS capsule Take 0.8 mg by mouth at bedtime.   Marland Kitchen tiZANidine (ZANAFLEX) 4 MG tablet Take 4 mg by mouth every 6 (six) hours as needed for muscle spasms.     Allergies: Allergies as of 08/23/2017  . (No Known Allergies)   Past Medical History:  Diagnosis Date  . AK (actinic keratosis) 2014   s/p efudex cream treatment  . Arthritis   . HOH (hard of hearing) left   doesn't use hearing aid  . Hypertension   . Imbalance   . Lumbago   . Peripheral neuropathy    [place]    Family History:  Family History  Problem Relation Age of Onset  . CAD Father        MI  . Cancer Mother        unsure  . Stroke Neg Hx   . Diabetes Neg Hx     Social History:  Social History   Tobacco Use  . Smoking status: Never Smoker  . Smokeless tobacco:  Never Used  Substance Use Topics  . Alcohol use: Yes    Alcohol/week: 4.2 oz    Types: 7 Shots of liquor per week    Comment: Daily shot of vodka  . Drug use: No    Review of Systems: A complete ROS was negative except as per HPI.   Physical Exam: Blood pressure (!) 141/68, pulse (!) 58, resp. rate 15, height 6' (1.829 m), weight 228 lb (103.4 kg), SpO2 96 %.  Physical Exam  Constitutional: He is oriented to person, place, and time. He appears well-developed and well-nourished. No distress.  HENT:  Head: Normocephalic and atraumatic.  Eyes: Pupils are equal, round, and reactive to light. Conjunctivae and EOM are normal. No scleral icterus.  Neck: Normal range of motion. Neck supple.  Cardiovascular: Normal rate and intact distal pulses. An irregularly irregular rhythm present.  Pulmonary/Chest:  Effort normal and breath sounds normal.  Abdominal: Soft. Bowel sounds are normal. There is no tenderness.  Musculoskeletal: Normal range of motion. He exhibits no edema.  Neurological: He is alert and oriented to person, place, and time. He has normal strength and normal reflexes. A cranial nerve deficit (minmal dysarthria, mild flattening of nasolabia fold on left side, minimal left sided mouth droop) is present. No sensory deficit.  Strength 5/5 and symmetric bilaterally in upper and lower extremity; no focal deficit      EKG: personally reviewed my interpretation is atrial fibrillation, HR 75, RBBB   CXR: none to review  Assessment & Plan by Problem: Principal Problem:   Focal neurological deficit Active Problems:   Atrial fibrillation (HCC)   Acute focal neurological deficit, onset within 3-24 hours  Acute CVA  Patient presenting with acute onset left sided facial droop and slurred speech in the setting of new onset atrial fibrillation concerning for embolic ischemic stroke. Physical exam with mild dysarthria, minimal left sided facial droop and nasolabial fold flattening. Strength intact and symmetric in upper and lower extremities bilaterally. Admitting for stroke evaluation. tPA not given.  -ASA 325 mg daily -MRI brain pending  -ECHO -Carotid U/S -Lipid panel, Hemoglobin A1C -Cardiac monitoring -PT/OT/SLP  Atrial Fibrillation New onset, currently rate controlled.  -Cardiac monitoring -Echocardiogram pending  -TSH pending   HTN BP mildly elevated 141/68. On  losartan-HCTZ 100-25 mg at home.  -Holding for now in setting of CVA and ?AKI  BPH -Continue home medications: flomax 0.8 mg daily and finasteride 5 mg at bedtime  -Monitor urine output   Elevated Creatinine ?AKI, creatinine 1.74, creatinine 3 years ago 1.1  -Repeat CMET in AM -Gentle IVF hydration, NS 75 cc/hr  Normocytic anemia Hgb 10.5, MCV 86.7, seems to be at patients baseline from prior labs -repeat  CBC in AM   Code Status: DNR confirmed on admission  DVT pxx: [place] heparin Diet: NPO   Dispo: Admit patient to Inpatient with expected length of stay greater than 2 midnights.  Signed: Toney RakesLacroce, Samantha J, MD 08/23/2017, 3:59 PM  Pager: [phone]

## 2017-08-23 NOTE — ED Notes (Signed)
Attempted report x 2 

## 2017-08-23 NOTE — ED Notes (Signed)
ED Provider at bedside. 

## 2017-08-23 NOTE — ED Notes (Signed)
Pt taken strait back to triage from NF after check in and placed in room 2.  Toniann FailWendy RN made aware pt there.

## 2017-08-23 NOTE — ED Notes (Signed)
Patient is resting comfortably. 

## 2017-08-24 ENCOUNTER — Observation Stay (HOSPITAL_COMMUNITY): Payer: Medicare PPO

## 2017-08-24 ENCOUNTER — Encounter (HOSPITAL_COMMUNITY): Payer: Self-pay | Admitting: General Practice

## 2017-08-24 ENCOUNTER — Observation Stay (HOSPITAL_BASED_OUTPATIENT_CLINIC_OR_DEPARTMENT_OTHER): Payer: Medicare PPO

## 2017-08-24 DIAGNOSIS — R29702 NIHSS score 2: Secondary | ICD-10-CM | POA: Diagnosis present

## 2017-08-24 DIAGNOSIS — G629 Polyneuropathy, unspecified: Secondary | ICD-10-CM | POA: Diagnosis present

## 2017-08-24 DIAGNOSIS — R29810 Facial weakness: Secondary | ICD-10-CM

## 2017-08-24 DIAGNOSIS — I4891 Unspecified atrial fibrillation: Secondary | ICD-10-CM | POA: Diagnosis not present

## 2017-08-24 DIAGNOSIS — R471 Dysarthria and anarthria: Secondary | ICD-10-CM | POA: Diagnosis not present

## 2017-08-24 DIAGNOSIS — E785 Hyperlipidemia, unspecified: Secondary | ICD-10-CM | POA: Diagnosis present

## 2017-08-24 DIAGNOSIS — R7989 Other specified abnormal findings of blood chemistry: Secondary | ICD-10-CM | POA: Diagnosis not present

## 2017-08-24 DIAGNOSIS — Z7901 Long term (current) use of anticoagulants: Secondary | ICD-10-CM | POA: Diagnosis not present

## 2017-08-24 DIAGNOSIS — N400 Enlarged prostate without lower urinary tract symptoms: Secondary | ICD-10-CM | POA: Diagnosis present

## 2017-08-24 DIAGNOSIS — I63531 Cerebral infarction due to unspecified occlusion or stenosis of right posterior cerebral artery: Secondary | ICD-10-CM

## 2017-08-24 DIAGNOSIS — R29818 Other symptoms and signs involving the nervous system: Secondary | ICD-10-CM | POA: Diagnosis present

## 2017-08-24 DIAGNOSIS — Z79899 Other long term (current) drug therapy: Secondary | ICD-10-CM | POA: Diagnosis not present

## 2017-08-24 DIAGNOSIS — D649 Anemia, unspecified: Secondary | ICD-10-CM

## 2017-08-24 DIAGNOSIS — G459 Transient cerebral ischemic attack, unspecified: Secondary | ICD-10-CM | POA: Diagnosis not present

## 2017-08-24 DIAGNOSIS — I6529 Occlusion and stenosis of unspecified carotid artery: Secondary | ICD-10-CM | POA: Diagnosis present

## 2017-08-24 DIAGNOSIS — Z66 Do not resuscitate: Secondary | ICD-10-CM | POA: Diagnosis present

## 2017-08-24 DIAGNOSIS — N179 Acute kidney failure, unspecified: Secondary | ICD-10-CM | POA: Diagnosis present

## 2017-08-24 DIAGNOSIS — Z6830 Body mass index (BMI) 30.0-30.9, adult: Secondary | ICD-10-CM | POA: Diagnosis not present

## 2017-08-24 DIAGNOSIS — I639 Cerebral infarction, unspecified: Principal | ICD-10-CM

## 2017-08-24 DIAGNOSIS — I351 Nonrheumatic aortic (valve) insufficiency: Secondary | ICD-10-CM

## 2017-08-24 DIAGNOSIS — R4781 Slurred speech: Secondary | ICD-10-CM | POA: Diagnosis present

## 2017-08-24 DIAGNOSIS — E669 Obesity, unspecified: Secondary | ICD-10-CM | POA: Diagnosis present

## 2017-08-24 DIAGNOSIS — I10 Essential (primary) hypertension: Secondary | ICD-10-CM | POA: Diagnosis present

## 2017-08-24 DIAGNOSIS — I350 Nonrheumatic aortic (valve) stenosis: Secondary | ICD-10-CM | POA: Diagnosis present

## 2017-08-24 DIAGNOSIS — Z8249 Family history of ischemic heart disease and other diseases of the circulatory system: Secondary | ICD-10-CM | POA: Diagnosis not present

## 2017-08-24 DIAGNOSIS — I480 Paroxysmal atrial fibrillation: Secondary | ICD-10-CM | POA: Diagnosis present

## 2017-08-24 LAB — COMPREHENSIVE METABOLIC PANEL
ALBUMIN: 3.2 g/dL — AB (ref 3.5–5.0)
ALT: 11 U/L — ABNORMAL LOW (ref 17–63)
ANION GAP: 8 (ref 5–15)
AST: 17 U/L (ref 15–41)
Alkaline Phosphatase: 77 U/L (ref 38–126)
BILIRUBIN TOTAL: 1.1 mg/dL (ref 0.3–1.2)
BUN: 19 mg/dL (ref 6–20)
CO2: 25 mmol/L (ref 22–32)
Calcium: 8.8 mg/dL — ABNORMAL LOW (ref 8.9–10.3)
Chloride: 105 mmol/L (ref 101–111)
Creatinine, Ser: 1.46 mg/dL — ABNORMAL HIGH (ref 0.61–1.24)
GFR, EST AFRICAN AMERICAN: 47 mL/min — AB (ref >60)
GFR, EST NON AFRICAN AMERICAN: 41 mL/min — AB (ref >60)
Glucose, Bld: 111 mg/dL — ABNORMAL HIGH (ref 65–99)
POTASSIUM: 4.1 mmol/L (ref 3.5–5.1)
Sodium: 138 mmol/L (ref 135–145)
TOTAL PROTEIN: 5.1 g/dL — AB (ref 6.5–8.1)

## 2017-08-24 LAB — CBC
HCT: 32.2 % — ABNORMAL LOW (ref 39.0–52.0)
HEMOGLOBIN: 10.5 g/dL — AB (ref 13.0–17.0)
MCH: 28.2 pg (ref 26.0–34.0)
MCHC: 32.6 g/dL (ref 30.0–36.0)
MCV: 86.6 fL (ref 78.0–100.0)
Platelets: 146 10*3/uL — ABNORMAL LOW (ref 150–400)
RBC: 3.72 MIL/uL — AB (ref 4.22–5.81)
RDW: 16.6 % — ABNORMAL HIGH (ref 11.5–15.5)
WBC: 5.8 10*3/uL (ref 4.0–10.5)

## 2017-08-24 LAB — LIPID PANEL
CHOL/HDL RATIO: 3.6 ratio
Cholesterol: 147 mg/dL (ref 0–200)
HDL: 41 mg/dL (ref >40)
LDL Cholesterol: 86 mg/dL (ref 0–99)
TRIGLYCERIDES: 102 mg/dL (ref <150)
VLDL: 20 mg/dL (ref 0–40)

## 2017-08-24 LAB — HEMOGLOBIN A1C
Hgb A1c MFr Bld: 5 % (ref 4.8–5.6)
Mean Plasma Glucose: 96.8 mg/dL

## 2017-08-24 LAB — ECHOCARDIOGRAM COMPLETE
HEIGHTINCHES: 72 in
Weight: 3590.85 oz

## 2017-08-24 MED ORDER — DICLOFENAC SODIUM 1 % TD GEL
4.0000 g | Freq: Four times a day (QID) | TRANSDERMAL | Status: DC
  Administered 2017-08-24 – 2017-08-25 (×3): 4 g via TOPICAL
  Filled 2017-08-24: qty 100

## 2017-08-24 MED ORDER — HYDROCHLOROTHIAZIDE 25 MG PO TABS
25.0000 mg | ORAL_TABLET | Freq: Every day | ORAL | Status: DC
Start: 2017-08-24 — End: 2017-08-25
  Administered 2017-08-24 – 2017-08-25 (×2): 25 mg via ORAL
  Filled 2017-08-24 (×2): qty 1

## 2017-08-24 MED ORDER — LOSARTAN POTASSIUM 50 MG PO TABS: 25.0000 mg | ORAL_TABLET | Freq: Every day | ORAL | Status: DC

## 2017-08-24 MED ORDER — LOSARTAN POTASSIUM 50 MG PO TABS
100.0000 mg | ORAL_TABLET | Freq: Every day | ORAL | Status: DC
  Filled 2017-08-24 (×2): qty 2

## 2017-08-24 MED ORDER — LOSARTAN POTASSIUM-HCTZ 100-25 MG PO TABS: 1.0000 | ORAL_TABLET | Freq: Every day | ORAL | Status: DC

## 2017-08-24 MED ORDER — RAMELTEON 8 MG PO TABS
8.0000 mg | ORAL_TABLET | Freq: Every day | ORAL | Status: DC
  Administered 2017-08-24 (×2): 8 mg via ORAL
  Filled 2017-08-24 (×2): qty 1

## 2017-08-24 MED ORDER — HYDROCHLOROTHIAZIDE 25 MG PO TABS: 25.0000 mg | ORAL_TABLET | Freq: Every day | ORAL | Status: DC

## 2017-08-24 MED ORDER — APIXABAN 5 MG PO TABS
5.0000 mg | ORAL_TABLET | Freq: Two times a day (BID) | ORAL | Status: DC
  Administered 2017-08-24 – 2017-08-25 (×3): 5 mg via ORAL
  Filled 2017-08-24 (×3): qty 1

## 2017-08-24 NOTE — Progress Notes (Signed)
VASCULAR LAB PRELIMINARY  PRELIMINARY  PRELIMINARY  PRELIMINARY  Carotid duplex completed.    Preliminary report:  1-39% ICA stenosis.  Vertebral artery flow is antegrade.   Rockland Kotarski, RVT 08/24/2017, 12:01 PM

## 2017-08-24 NOTE — Progress Notes (Signed)
   Subjective:  Patient seen and examined. No acute events overnight. Per patient and his daughters his facial droop and slurred speech have completely resolved. The patient is eager to be discharge. He is amenable to staying for ECHO and carotid dopplers. Discussed atrial fibrillation with the patient and his increased risk of future strokes. No other acute complaints.   Objective:  Vital signs in last 24 hours: Vitals:   08/23/17 2030 08/23/17 2143 08/24/17 0359 08/24/17 0600  BP: (!) 136/92 (!) 143/85 (!) 160/76   Pulse: 69  68   Resp: 18  16   Temp:  98.4 F (36.9 C) 97.9 F (36.6 C)   TempSrc:  Oral Oral   SpO2: 95%  96%   Weight:  225 lb 5 oz (102.2 kg)  224 lb 6.9 oz (101.8 kg)  Height:  6' (1.829 m)     General: Laying in bed comfortably, NAD HEENT: [place]/AT, EOMI, no scleral icterus, PERRL Cardiac: RRR, No R/M/G appreciated Pulm: normal effort, CTAB in anterior and lateral lung fields Abd: soft, non tender, non distended, BS normal Ext: extremities well perfused, no peripheral edema Neuro: alert and oriented X3, cranial nerves II-XII grossly intact; no focal neuro deficits, dysarthria has improved since admission, facial droop has also seemed to have resolved; subtle nasolabial fold flattening    Assessment/Plan:  Principal Problem:   Acute CVA (cerebrovascular accident) (HCC) Active Problems:   Atrial fibrillation (HCC)   Focal neurological deficit  Acute CVA  Patient presenting with acute onset left sided facial droop and slurred speech. MRI demonstrated 8 mm acute ischemic nonhemorrhagic small vessel type subcortical infarct involving the posterior right frontal lobe. Neuro exam today with improvement in deficits with minimal dysarthria and subtle left sided nasolabial fold flattening. Strength intact and symmetric in upper and lower extremities bilaterally.  -ASA 325 mg daily -Lipitor 80 mg daily  -ECHO pending - will f/u -Carotid U/S pending -will f/u -Lipid  panel, Hemoglobin A1C - Lipid panel WNL, A1C 5.0 -Cardiac monitoring -PT/OT/SLP  Atrial Fibrillation New onset, currently rate controlled. CHA2DS2VASc score of 5, 7.2% yearly risk of stroke. Will benefit from anticoagulation.  -Cardiac monitoring -Echocardiogram pending  -TSH normal -Will start Eliquis   HTN BP elevated 160/76. On  losartan-HCTZ 100-25 mg at home. Will restart BP medication as has been >24 hours since onset of stroke symptoms.  -Hyzaar daily   BPH -Continue home medications: flomax 0.8 mg daily and finasteride 5 mg at bedtime   Elevated Creatinine ?AKI, creatinine 1.74, improved with gentle fluid hydration to 1.46   Normocytic anemia Hgb 10.5, MCV 86.7, seems to be at patients baseline from prior labs. Remains stable on repeat labs this AM.     Dispo: Anticipated discharge in approximately 0-1 day(s) pending completion of stroke work up.   Toney RakesLacroce, Naevia Unterreiner J, MD 08/24/2017, 6:45 AM Pager: [phone]

## 2017-08-24 NOTE — Progress Notes (Signed)
  Echocardiogram 2D Echocardiogram has been performed.  Delcie RochENNINGTON, Nael Petrosyan 08/24/2017, 11:52 AM

## 2017-08-24 NOTE — Progress Notes (Signed)
SLP Cancellation Note  Patient Details Name: Shane BroomsRobert Wilson Prince MRN: [id number] DOB: 08-03-1926   Cancelled treatment:       Reason Eval/Treat Not Completed: Patient at procedure or test/unavailable   [name], [name] 08/24/2017, 11:00 AM  [name], M.A. CCC-SLP [phone]

## 2017-08-24 NOTE — Evaluation (Signed)
Speech Language Pathology Evaluation Patient Details Name: Shane Prince MRN: [id number] DOB: 1926-11-13 Today's Date: 08/24/2017 Time: 1914-7829 SLP Time Calculation (min) (ACUTE ONLY): 25 min  Problem List:  Patient Active Problem List   Diagnosis Date Noted  . Acute focal neurological deficit, onset within 3-24 hours 08/24/2017  . Atrial fibrillation (HCC) 08/23/2017  . Focal neurological deficit 08/23/2017  . Acute CVA (cerebrovascular accident) (HCC) 08/23/2017  . Peripheral neuropathy   . Medicare annual wellness visit, subsequent 12/21/2013  . Onychomycosis 12/21/2013  . Imbalance   . Hypertension   . AK (actinic keratosis)   . Hip arthritis 06/01/2013   Past Medical History:  Past Medical History:  Diagnosis Date  . AK (actinic keratosis) 2014   s/p efudex cream treatment  . Arthritis   . HOH (hard of hearing) left   doesn't use hearing aid  . Hypertension   . Imbalance   . Lumbago   . Peripheral neuropathy    [facility]   Past Surgical History:  Past Surgical History:  Procedure Laterality Date  . BACK SURGERY  2007   L3-4,5  . CARPAL TUNNEL RELEASE Left 2012  . CARPAL TUNNEL RELEASE  11/27/2011   Procedure: CARPAL TUNNEL RELEASE;  Surgeon: Drucilla Schmidt, MD;  Service: Orthopedics;  Laterality: Right;  . CATARACT EXTRACTION Bilateral 2008  . foot spur     toe  . KNEE ARTHROSCOPY Right 1960  . TOTAL HIP ARTHROPLASTY Right 2008  . TOTAL HIP ARTHROPLASTY Left 06/01/2013   Procedure: TOTAL HIP ARTHROPLASTY;  Surgeon: Nestor Lewandowsky, MD;  Location: [facility] OR;  Service: Orthopedics;  Laterality: Left;  . ULNAR NERVE TRANSPOSITION  11/27/2011   Procedure: ULNAR NERVE DECOMPRESSION/TRANSPOSITION;  Surgeon: Drucilla Schmidt, MD;  Location: [facility];  Service: Orthopedics;  Laterality: Right;  RIGHT ELBOW DECOMPRESSION OF THE ULNAR NERVE   HPI:  [name] is a 84 y.o. male with history of HTN, BPH, and peripheral neuropathy presenting  with sudden onset left facial droop and slurred speech associated with newly diagnosed atrial fibrillation. He did not receive IV t-PA due to late presentation. MRI revealed 8 mm acute ischemic nonhemorrhagic small vessel type subcortical infarct involving the posterior right frontal lobe.   Assessment / Plan / Recommendation Clinical Impression   Patient presents with moderate to severe cognitive communication impairment. Deficits noted in sustained attention, awareness of deficits and safety awareness, judgment, memory, functional problem solving and organization. Pt states he has baseline memory deficits. Pt scored 18/30 on MoCA Basic Pt blamed his errors on premorbid memory issues. After assessment, SLP used pt's errors in clock drawing and digit naming to illustrate pt's difficulties with organization and attention; he continued to deny changes, as well as physical deficits or issues with balance noted by PT. Educated pt that 24 hour supervision is recommended to ensure his safety; pt disagrees with this assessment. Attempted to locate pt's daughter for further discussion, however she left the room during evaluation and I was unable to locate her before leaving the floor. Will follow up for pt/caregiver education and interventions to promote awareness of deficits.         SLP Assessment  SLP Recommendation/Assessment: Patient needs continued Speech Lanaguage Pathology Services SLP Visit Diagnosis: Cognitive communication deficit (R41.841);Attention and concentration deficit;Frontal lobe and executive function deficit Attention and concentration deficit following: Cerebral infarction Frontal lobe and executive function deficit following: Cerebral infarction    Follow Up Recommendations  Outpatient SLP;24 hour supervision/assistance  Frequency and Duration min 2x/week  1 week      SLP Evaluation Cognition  Overall Cognitive Status: Impaired/Different from baseline Arousal/Alertness:  Awake/alert Orientation Level: Oriented X4 Attention: Sustained Sustained Attention: Impaired Sustained Attention Impairment: Verbal basic;Functional basic Memory: Impaired Memory Impairment: Storage deficit;Decreased recall of new information;Decreased short term memory Decreased Short Term Memory: Verbal basic;Functional basic(delayed recall 0/5, pt did not recall pt recommendations ) Awareness: Impaired Awareness Impairment: Intellectual impairment Problem Solving: Impaired Problem Solving Impairment: Verbal basic;Functional basic(pt able to name 2/3 ways to make $13) Executive Function: Reasoning;Sequencing;Organizing;Decision Making;Self Monitoring;Self Correcting Reasoning: Impaired Reasoning Impairment: Verbal basic;Functional basic Sequencing: Impaired Sequencing Impairment: Functional basic Organizing: Impaired Organizing Impairment: Functional basic(clock drawing impaired) Decision Making: Impaired Decision Making Impairment: Functional basic Self Monitoring: Impaired Self Monitoring Impairment: Functional basic Self Correcting: Impaired Self Correcting Impairment: Functional basic Behaviors: Restless;Impulsive;Poor frustration tolerance Safety/Judgment: Impaired       Comprehension  Auditory Comprehension Overall Auditory Comprehension: Impaired Yes/No Questions: Within Functional Limits Commands: Impaired One Step Basic Commands: 75-100% accurate Multistep Basic Commands: 50-74% accurate Conversation: Simple Interfering Components: Hearing;Attention;Processing speed EffectiveTechniques: Extra processing time;Repetition;Stressing words;Visual/Gestural cues Visual Recognition/Discrimination Discrimination: Within Function Limits Reading Comprehension Reading Status: Not tested    Expression Expression Primary Mode of Expression: Verbal Verbal Expression Overall Verbal Expression: Appears within functional limits for tasks assessed Written Expression Dominant  Hand: Right Written Expression: Not tested   Oral / Motor  Oral Motor/Sensory Function Overall Oral Motor/Sensory Function: Within functional limits Motor Speech Overall Motor Speech: Appears within functional limits for tasks assessed   GO                   Sherod Cisse Beth Charbel Los, MS, CCC-SRondel BatonLP Speech-Language Pathologist [phone]  Arlana LindauMary E Shirlee Whitmire 08/24/2017, 3:59 PM

## 2017-08-24 NOTE — Evaluation (Signed)
Physical Therapy Evaluation Patient Details Name: Gust BroomsRobert Wilson Behar MRN: [id number] DOB: 12-27-1926 Today's Date: 08/24/2017   History of Present Illness  Patient is an 84 y/o male who presents with dizziness, slurred speech and left facial droop. NIH:2. Admitted with new A-fib. MRI- 8 mm acute infarct posterior right frontal lobe. PMH includes HTN, peripheral neuropathy, lumbargo.  Clinical Impression  Patient presents with impaired balance, dyspnea on exertion, decreased activity tolerance and impaired mobility s/p above. Pt impulsive and not happy about being in the hospital. Pt lives alone and independent PTA. Tolerated gait training with Min A due to LOB requiring support to prevent fall. Might benefit from RW for support. Education re: signs/symptoms of stroke. Pt with irregular HR throughout session and 2/4 DOE which pt reports as baseline. Will follow acutely to maximize independence and mobility prior to return home. Discussed symptoms of frontal stroke with daughters and answered all questions.     Follow Up Recommendations Outpatient PT;Supervision for mobility/OOB    Equipment Recommendations  None recommended by PT    Recommendations for Other Services       Precautions / Restrictions Precautions Precautions: Fall Precaution Comments: A-fib Restrictions Weight Bearing Restrictions: No      Mobility  Bed Mobility Overal bed mobility: Modified Independent             General bed mobility comments: No assist needed.   Transfers Overall transfer level: Needs assistance Equipment used: None Transfers: Sit to/from Stand Sit to Stand: Supervision         General transfer comment: Supervision for safety. Stood from AllstateEOB x1.   Ambulation/Gait Ambulation/Gait assistance: Min assist Ambulation Distance (Feet): 200 Feet Assistive device: None Gait Pattern/deviations: Step-through pattern;Decreased stride length;Staggering right;Staggering left;Drifts  right/left;Shuffle Gait velocity: decreased   General Gait Details: Slow, unsteady gait with LOB towards left x1 requiring Min A to prevent fall. Staggers right/left-reports as baseline. 2/4 DOE noted. MIght benefit from RW. HR irregular.  Stairs            Wheelchair Mobility    Modified Rankin (Stroke Patients Only) Modified Rankin (Stroke Patients Only) Pre-Morbid Rankin Score: Slight disability Modified Rankin: Moderately severe disability     Balance Overall balance assessment: Needs assistance;History of Falls Sitting-balance support: Feet supported;No upper extremity supported Sitting balance-Leahy Scale: Good     Standing balance support: During functional activity Standing balance-Leahy Scale: Fair                               Pertinent Vitals/Pain Pain Assessment: Faces Faces Pain Scale: Hurts little more Pain Location: bil hips Pain Descriptors / Indicators: Aching;Sore Pain Intervention(s): Monitored during session;Repositioned;Premedicated before session;Limited activity within patient's tolerance    Home Living Family/patient expects to be discharged to:: Private residence Living Arrangements: Alone Available Help at Discharge: Family;Available 24 hours/day Type of Home: House Home Access: Stairs to enter Entrance Stairs-Rails: Left;Right;Can reach both Entrance Stairs-Number of Steps: 2 Home Layout: One level;Laundry or work area in Pitney Bowesbasement Home Equipment: Environmental consultantWalker - 2 wheels;Cane - single point      Prior Function Level of Independence: Independent         Comments: Drives, cooks. Stubborn at baseline per daughters. Reports 1 fall 1 year ago when he passed out.  Poor balance at baseline due to 2 hip replacements that failed.      Hand Dominance        Extremity/Trunk Assessment   Upper Extremity  Assessment Upper Extremity Assessment: Defer to OT evaluation    Lower Extremity Assessment Lower Extremity Assessment:  Generalized weakness    Cervical / Trunk Assessment Cervical / Trunk Assessment: Kyphotic  Communication   Communication: No difficulties  Cognition Arousal/Alertness: Awake/alert Behavior During Therapy: Impulsive                                   General Comments: Pt with questionable safety awareness. Stubborn at baseline per daughters. Impulsive during assessment today. A&Ox4.      General Comments General comments (skin integrity, edema, etc.): Spoke with daughters about signs to look for which can be caused by frontal infarct.    Exercises     Assessment/Plan    PT Assessment Patient needs continued PT services  PT Problem List Decreased strength;Decreased mobility;Decreased safety awareness;Decreased balance;Pain;Cardiopulmonary status limiting activity;Decreased cognition;Decreased activity tolerance       PT Treatment Interventions DME instruction;Balance training;Patient/family education;Functional mobility training;Gait training;Therapeutic activities;Neuromuscular re-education;Therapeutic exercise;Cognitive remediation;Stair training    PT Goals (Current goals can be found in the Care Plan section)  Acute Rehab PT Goals Patient Stated Goal: to go home ASAP PT Goal Formulation: With patient Time For Goal Achievement: 09/07/17 Potential to Achieve Goals: Good    Frequency Min 4X/week   Barriers to discharge Decreased caregiver support lives alone    Co-evaluation               AM-PAC PT "6 Clicks" Daily Activity  Outcome Measure Difficulty turning over in bed (including adjusting bedclothes, sheets and blankets)?: None Difficulty moving from lying on back to sitting on the side of the bed? : None Difficulty sitting down on and standing up from a chair with arms (e.g., wheelchair, bedside commode, etc,.)?: None Help needed moving to and from a bed to chair (including a wheelchair)?: None Help needed walking in hospital room?: A  Little Help needed climbing 3-5 steps with a railing? : A Little 6 Click Score: 22    End of Session Equipment Utilized During Treatment: Gait belt Activity Tolerance: Patient tolerated treatment well Patient left: in bed;with call bell/phone within reach;with bed alarm set Nurse Communication: Mobility status PT Visit Diagnosis: Unsteadiness on feet (R26.81);Difficulty in walking, not elsewhere classified (R26.2)    Time: 1436-1500 PT Time Calculation (min) (ACUTE ONLY): 24 min   Charges:   PT Evaluation $PT Eval Moderate Complexity: 1 Mod PT Treatments $Gait Training: 8-22 mins   PT G Codes:        Mylo Red, PT, DPT [phone]    Blake Divine A Quantarius Genrich 08/24/2017, 3:07 PM

## 2017-08-24 NOTE — Evaluation (Signed)
Occupational Therapy Evaluation Patient Details Name: Gust BroomsRobert Wilson Wiedemann MRN: [id number] DOB: January 19, 1927 Today's Date: 08/24/2017    History of Present Illness Patient is an 84 y/o male who presents with dizziness, slurred speech and left facial droop. NIH:2. Admitted with new A-fib. MRI- 8 mm acute infarct posterior right frontal lobe. PMH includes HTN, peripheral neuropathy, lumbargo.   Clinical Impression   PTA, pt was independent with ADL and functional mobility. He reports history of 2 hip replacements that have impacted his balance at baseline. Pt presents with decreased stability during functional mobility, impulsivity, decreased awareness, decreased attention, and poor insight into deficits. Pt currently requires min guard assist for toilet transfers and standing grooming tasks with reminders to attend to the task at hand. He utilizes sock aid at home for LB ADL due to limited hip mobility. Pt would benefit from continued OT services while admitted to improve independence and safety with ADL and functional mobility. Recommend outpatient OT follow-up to address cognitive deficits related to ADL and IADL safety. Will continue to follow while admitted.    Follow Up Recommendations  Outpatient OT;Supervision/Assistance - 24 hour(depending on progress)    Equipment Recommendations  3 in 1 bedside commode    Recommendations for Other Services       Precautions / Restrictions Precautions Precautions: Fall Precaution Comments: A-fib Restrictions Weight Bearing Restrictions: No      Mobility Bed Mobility Overal bed mobility: Modified Independent             General bed mobility comments: No assist needed.   Transfers Overall transfer level: Needs assistance Equipment used: None Transfers: Sit to/from Stand Sit to Stand: Min guard         General transfer comment: Min guard assist for safety.     Balance Overall balance assessment: Needs assistance;History of  Falls Sitting-balance support: Feet supported;No upper extremity supported Sitting balance-Leahy Scale: Good     Standing balance support: During functional activity Standing balance-Leahy Scale: Fair Standing balance comment: Statically able to stand without UE support.                            ADL either performed or assessed with clinical judgement   ADL Overall ADL's : Needs assistance/impaired Eating/Feeding: Set up;Sitting   Grooming: Min guard;Standing   Upper Body Bathing: Supervision/ safety;Sitting   Lower Body Bathing: Maximal assistance;Sit to/from stand Lower Body Bathing Details (indicate cue type and reason): Uses AE at baseline Upper Body Dressing : Supervision/safety;Sitting   Lower Body Dressing: Sit to/from stand;Maximal assistance Lower Body Dressing Details (indicate cue type and reason): Uses AE at baseline. Toilet Transfer: Min guard;Ambulation;BSC   Toileting- ArchitectClothing Manipulation and Hygiene: Min guard;Sit to/from stand       Functional mobility during ADLs: Min guard General ADL Comments: Pt would likely demonstrate ability to complete LB ADL with AE without physical assistance but did not attempt with these items this session. Min guard assist for stability. Pt with decreased awareness of safety.      Vision Patient Visual Report: No change from baseline Additional Comments: Will continue to assess. No deficits noted on evaluation.      Perception     Praxis      Pertinent Vitals/Pain Pain Assessment: Faces Faces Pain Scale: No hurt Pain Location: bil hips Pain Descriptors / Indicators: Aching;Sore Pain Intervention(s): Monitored during session     Hand Dominance Right   Extremity/Trunk Assessment Upper Extremity Assessment Upper Extremity  Assessment: Generalized weakness   Lower Extremity Assessment Lower Extremity Assessment: Generalized weakness   Cervical / Trunk Assessment Cervical / Trunk Assessment:  Kyphotic   Communication Communication Communication: No difficulties   Cognition Arousal/Alertness: Awake/alert Behavior During Therapy: Impulsive Overall Cognitive Status: Impaired/Different from baseline Area of Impairment: Attention;Memory;Following commands;Safety/judgement;Awareness;Problem solving                   Current Attention Level: Selective Memory: Decreased short-term memory Following Commands: Follows multi-step commands inconsistently Safety/Judgement: Decreased awareness of safety;Decreased awareness of deficits Awareness: Emergent Problem Solving: Slow processing General Comments: Pt with decreased safety awareness and impulsivity. Declines diminished mobility or cognition despite education by all therapy staff involved.    General Comments  Educated pt and family concerning safety concerns related to current deficits.     Exercises     Shoulder Instructions      Home Living Family/patient expects to be discharged to:: Private residence Living Arrangements: Alone Available Help at Discharge: Family;Available 24 hours/day Type of Home: House Home Access: Stairs to enter [employer] of Steps: 2 Entrance Stairs-Rails: Left;Right;Can reach both Home Layout: One level;Laundry or work area in basement     [employer] Shower/Tub: Chief Strategy Officer: Handicapped height     Home Equipment: Environmental consultant - 2 wheels;Gilmer Mor - single point      Lives With: Alone    Prior Functioning/Environment Level of Independence: Independent        Comments: Drives, cooks. Per PT note family reports stubbornness at baseline. Pt with history of 2 failed hip replacements impacting balance and mobility.         OT Problem List: Decreased range of motion;Decreased strength;Decreased activity tolerance;Impaired balance (sitting and/or standing);Impaired vision/perception;Decreased safety awareness;Decreased cognition;Decreased knowledge of use of  DME or AE;Decreased knowledge of precautions;Pain      OT Treatment/Interventions: Self-care/ADL training;Therapeutic exercise;Energy conservation;DME and/or AE instruction;Therapeutic activities;Patient/family education;Balance training;Cognitive remediation/compensation    OT Goals(Current goals can be found in the care plan section) Acute Rehab OT Goals Patient Stated Goal: to go home ASAP OT Goal Formulation: With patient Time For Goal Achievement: 09/07/17 Potential to Achieve Goals: Good ADL Goals Pt Will Perform Grooming: with modified independence;standing Pt Will Transfer to Toilet: with modified independence;ambulating;regular height toilet Pt Will Perform Toileting - Clothing Manipulation and hygiene: with modified independence;sit to/from stand Additional ADL Goal #1: Pt will demonstrate anticipatory awareness during morning ADL routine in a moderately distracting environment. Additional ADL Goal #2: Pt will complete medication management tasks with no more than 1 VC for attention.  OT Frequency: Min 2X/week   Barriers to D/C:            Co-evaluation              AM-PAC PT "6 Clicks" Daily Activity     Outcome Measure Help from another person eating meals?: A Little Help from another person taking care of personal grooming?: A Little Help from another person toileting, which includes using toliet, bedpan, or urinal?: A Little Help from another person bathing (including washing, rinsing, drying)?: A Little Help from another person to put on and taking off regular upper body clothing?: A Little Help from another person to put on and taking off regular lower body clothing?: A Little 6 Click Score: 18   End of Session Equipment Utilized During Treatment: Gait belt Nurse Communication: Mobility status  Activity Tolerance: Patient tolerated treatment well Patient left: in bed;with call bell/phone within reach;with family/visitor present;with bed alarm  set(seated at  EOB)  OT Visit Diagnosis: Other abnormalities of gait and mobility (R26.89);Muscle weakness (generalized) (M62.81);Other symptoms and signs involving cognitive function                Time: 1610-9604 OT Time Calculation (min): 14 min Charges:  OT General Charges $OT Visit: 1 Visit OT Evaluation $OT Eval Moderate Complexity: 1 Mod G-Codes:     Doristine Section, MS OTR/L  Pager: [phone]   Harmony Sandell A Maurisa Tesmer 08/24/2017, 6:33 PM

## 2017-08-24 NOTE — Progress Notes (Signed)
STROKE TEAM PROGRESS NOTE   HISTORY OF PRESENT ILLNESS (per record) Shane Prince is a 84 y.o. male with a pmhx of HTN, BPH presenting from home. Patient reports he began feeling dizzy at 8:30 this morning. At 9:45am, family witnessed sudden onset left facial droop and slurred speech. Deficits initially improved, thus family did not bring him in for evaluation. Symptoms later worsened and he was brought in to the ED as a code stroke. Symptoms on arrival were mild with NIHSS of 2. CT head was negative for bleed and code stroke was canceled. Of note, patient was in atrial fibrillation with ventricular rate of 75 while in the ED. He denied chest pain, heart palpitations, or shortness of breath. He has never had a stroke or a heart attack and denies any knowledge of prior arrhythmia.   LKW: 8:30 am  tpa given?: no    SUBJECTIVE (INTERVAL HISTORY) His family is not at the bedside, he would like to go home    OBJECTIVE Temp:  [97.9 F (36.6 C)-98.4 F (36.9 C)] 98.3 F (36.8 C) (03/30 1246) Pulse Rate:  [60-74] 68 (03/30 0359) Cardiac Rhythm: Sinus bradycardia;Bundle branch block;Heart block (03/30 0700) Resp:  [15-20] 16 (03/30 0359) BP: (136-177)/(76-99) 177/91 (03/30 1246) SpO2:  [94 %-99 %] 96 % (03/30 0359) Weight:  [224 lb 6.9 oz (101.8 kg)-225 lb 5 oz (102.2 kg)] 224 lb 6.9 oz (101.8 kg) (03/30 0600)  CBC:  Recent Labs  Lab 08/23/17 1157  08/23/17 2159 08/24/17 0614  WBC 6.9  --  7.3 5.8  NEUTROABS 5.2  --   --   --   HGB 10.5*   < > 10.3* 10.5*  HCT 32.7*   < > 31.0* 32.2*  MCV 86.7  --  86.4 86.6  PLT 156  --  151 146*   < > = values in this interval not displayed.    Basic Metabolic Panel:  Recent Labs  Lab 08/23/17 1157 08/23/17 1203 08/23/17 2159 08/24/17 0614  NA 139 140  --  138  K 4.4 4.3  --  4.1  CL 108 106  --  105  CO2 22  --   --  25  GLUCOSE 135* 134*  --  111*  BUN 25* 24*  --  19  CREATININE 1.74* 1.70* 1.67* 1.46*  CALCIUM 8.8*  --    --  8.8*    Lipid Panel:     Component Value Date/Time   CHOL 147 08/24/2017 0614   TRIG 102 08/24/2017 0614   HDL 41 08/24/2017 0614   CHOLHDL 3.6 08/24/2017 0614   VLDL 20 08/24/2017 0614   LDLCALC 86 08/24/2017 0614   HgbA1c:  Lab Results  Component Value Date   HGBA1C 5.0 08/24/2017   Urine Drug Screen: No results found for: LABOPIA, COCAINSCRNUR, LABBENZ, AMPHETMU, THCU, LABBARB  Alcohol Level No results found for: [facility]  IMAGING   Mr Brain Wo Contrast 08/23/2017 IMPRESSION:  1. 8 mm acute ischemic nonhemorrhagic small vessel type subcortical infarct involving the posterior right frontal lobe.  2. No other acute intracranial abnormality.  3. Age-related cerebral atrophy with mild chronic small vessel ischemic disease.     Ct Head Code Stroke Wo Contrast 08/23/2017 IMPRESSION:  1. Mild generalized atrophy and white matter changes are largely within normal limits for age.  2. Periventricular white matter hypoattenuation in the left centrum semi of bowel is likely chronic.  3. No acute right-sided cortical or basal ganglia infarct.  4. Heterogeneous  hypoattenuation within the brainstem is nonspecific. Ischemia is not excluded.  5. ASPECTS is 10/10     Transthoracic Echocardiogram - Left ventricle: The cavity size was normal. Systolic function was   normal. The estimated ejection fraction was in the range of 60%   to 65%. Wall motion was normal; there were no regional wall   motion abnormalities. Doppler parameters are consistent with   abnormal left ventricular relaxation (grade 1 diastolic   dysfunction). 00/00/00   Bilateral Carotid Dopplers  08/24/2017 1-39% ICA stenosis.  Vertebral artery flow is antegrade.     PHYSICAL EXAM Vitals:   08/24/17 0359 08/24/17 0600 08/24/17 0800 08/24/17 1246  BP: (!) 160/76   (!) 177/91  Pulse: 68     Resp: 16     Temp: 97.9 F (36.6 C)  98 F (36.7 C) 98.3 F (36.8 C)  TempSrc: Oral   Oral  SpO2: 96%     Weight:   224 lb 6.9 oz (101.8 kg)    Height:       Physical exam: Exam: Gen: NAD, conversant, well nourised, obese, well groomed                     CV: RRR, no MRG. No Carotid Bruits. No peripheral edema, warm, nontender Eyes: Conjunctivae clear without exudates or hemorrhage  Neuro: Detailed Neurologic Exam  Speech:    Speech is normal; fluent and spontaneous with normal comprehension.  Cognition:    The patient is oriented to person, place, and time;   Cranial Nerves:    The pupils are equal, round, and reactive to light. Visual fields are full to finger confrontation. Extraocular movements are intact. Trigeminal sensation is intact and the muscles of mastication are normal. The face is symmetric. The palate elevates in the midline. Hearing intact. Voice is normal. Shoulder shrug is normal. The tongue has normal motion without fasciculations.   Coordination:    Normal finger to nose and heel to shin. Normal rapid alternating movements.   Motor Observation:    No asymmetry, no atrophy, and no involuntary movements noted. Tone:    Normal muscle tone.      Strength:    Strength is V/V in the upper and lower limbs.      Sensation: intact to LT       ASSESSMENT/PLAN [name] is a 84 y.o. male with history of HTN, BPH, and peripheral neuropathy presenting with sudden onset left facial droop and slurred speech associated with newly diagnosed atrial fibrillation. He did not receive IV t-PA due to late presentation.  Stroke:  subcortical infarct involving the posterior right frontal lobe - small vessel disease.  Resultant  No deficits  CT head - No acute right-sided cortical or basal ganglia infarct.   MRI head - 8 mm acute ischemic nonhemorrhagic small vessel type subcortical infarct.  MRA head - not performed  Carotid Doppler - unremarkble  2D Echo - no thrombus or pfo, normal EF   LDL 86  HgbA1c - 5.0  VTE prophylaxis - Eliquis Diet Heart Room service  appropriate? Yes; Fluid consistency: Thin  No antithrombotic prior to admission, now on aspirin 325 mg daily and Eliquis (apixaban) daily. Should be discharged on Eliquis along 5mg  bid  Patient counseled to be compliant with his antithrombotic medications  Ongoing aggressive stroke risk factor management  Therapy recommendations:  pending  Disposition:  Pending  Hypertension  Stable  Permissive hypertension (OK if < 220/120) but gradually normalize  in 5-7 days  Long-term BP goal normotensive  Hyperlipidemia  Lipid lowering medication PTA:  none  LDL 86, goal < 70  Current lipid lowering medication: Lipitor 80 mg daily  Continue statin at discharge   Other Stroke Risk Factors  Advanced age  ETOH use, advised to drink no more than 1 drink per day.  Obesity, Body mass index is 30.44 kg/m., recommend weight loss, diet and exercise as appropriate    Other Active Problems  Newly diagnosed atrial fibrillation   Plan / Recommendations   Stroke workup - await echo and therapy evaluations.  Discharge on Eliquis alone 5mg  bid, d/c ASA  Continue statin at discharge  Stroke will sign off at this time.  Personally examined patient and images, and have participated in and made any corrections needed to history, physical, neuro exam,assessment and plan as stated above.  I have personally obtained the history, evaluated lab date, reviewed imaging studies and agree with radiology interpretations.    Naomie Dean, MD Stroke Neurology   To contact Stroke Continuity provider, please refer to [email or web address]. After hours, contact General Neurology

## 2017-08-24 NOTE — Progress Notes (Signed)
PT Cancellation Note  Patient Details Name: Gust BroomsRobert Wilson Robak MRN: [id number] DOB: 11-30-1926   Cancelled Treatment:    Reason Eval/Treat Not Completed: Patient at procedure or test/unavailable pt off floor at vascular. Will follow up as time allows.   Blake DivineShauna A Nichola Warren 08/24/2017, 11:48 AM Mylo RedShauna Achaia Garlock, PT, DPT [phone]

## 2017-08-24 NOTE — Progress Notes (Signed)
ANTICOAGULATION CONSULT NOTE - Initial Consult  Pharmacy Consult for  Apixaban  Indication: atrial fibrillation non-valvular, new onset   No Known Allergies  Patient Measurements: Height: 6' (182.9 cm) Weight: 224 lb 6.9 oz (101.8 kg) IBW/kg (Calculated) : 77.6   Vital Signs: Temp: 98 F (36.7 C) (03/30 0800) Temp Source: Oral (03/30 0359) BP: 160/76 (03/30 0359) Pulse Rate: 68 (03/30 0359)  Labs: Recent Labs    08/23/17 1157 08/23/17 1203 08/23/17 2159 08/24/17 0614  HGB 10.5* 10.2* 10.3* 10.5*  HCT 32.7* 30.0* 31.0* 32.2*  PLT 156  --  151 146*  APTT 31  --   --   --   LABPROT 15.0  --   --   --   INR 1.19  --   --   --   CREATININE 1.74* 1.70* 1.67* 1.46*    Estimated Creatinine Clearance: 42.4 mL/min (A) (by C-G formula based on SCr of 1.46 mg/dL (H)).   Medical History: Past Medical History:  Diagnosis Date  . AK (actinic keratosis) 2014   s/p efudex cream treatment  . Arthritis   . HOH (hard of hearing) left   doesn't use hearing aid  . Hypertension   . Imbalance   . Lumbago   . Peripheral neuropathy    [place]    Medications:  Medications Prior to Admission  Medication Sig Dispense Refill Last Dose  . finasteride (PROSCAR) 5 MG tablet Take 5 mg by mouth at bedtime.   08/22/2017 at Unknown time  . losartan-hydrochlorothiazide (HYZAAR) 100-25 MG tablet Take 1 tablet by mouth at bedtime.    08/22/2017 at Unknown time  . naproxen sodium (ANAPROX) 220 MG tablet Take 660 mg by mouth daily as needed.    08/23/2017 at Unknown time  . tamsulosin (FLOMAX) 0.4 MG CAPS capsule Take 0.8 mg by mouth at bedtime.    08/22/2017 at Unknown time  . tiZANidine (ZANAFLEX) 4 MG tablet Take 4 mg by mouth every 6 (six) hours as needed for muscle spasms.   08/23/2017 at Unknown time    Assessment: 84 y.o male with acute CVA.  New onset Afib, non valvular , to start on Apixaban.  CHA2DS2VASc score of 5 Age >80, wt >60kg, SCr <1.5  (scr 1.46) thus  5mg  BID is appropriate  dose to start. If Scr increases =or >1.5, we will need lower dose to 2.5 mg BID.   Goal of Therapy:  Monitor platelets by anticoagulation protocol: Yes   Plan:  Discontinue SQ heparin now Apixaban 5 mg BID If SCr rise to 1.5 or greater, will need to decrease apixaban dose to 2.5mg  BID  Noah Delaineuth Obe Ahlers, RPh Clinical Pharmacist Pager: [phone]  08/24/2017,11:34 AM

## 2017-08-24 NOTE — Progress Notes (Signed)
Pt arrived to 3 [address] 35 about 2200.  Pt A&O x 4, no pain.   Pt V/S taken, WNL, NS running at 75 cc/hr.   Pt without distress. Back to base line neurologically.

## 2017-08-25 DIAGNOSIS — I6529 Occlusion and stenosis of unspecified carotid artery: Secondary | ICD-10-CM

## 2017-08-25 DIAGNOSIS — N400 Enlarged prostate without lower urinary tract symptoms: Secondary | ICD-10-CM

## 2017-08-25 DIAGNOSIS — I350 Nonrheumatic aortic (valve) stenosis: Secondary | ICD-10-CM

## 2017-08-25 DIAGNOSIS — D649 Anemia, unspecified: Secondary | ICD-10-CM

## 2017-08-25 DIAGNOSIS — Z7901 Long term (current) use of anticoagulants: Secondary | ICD-10-CM

## 2017-08-25 DIAGNOSIS — N179 Acute kidney failure, unspecified: Secondary | ICD-10-CM

## 2017-08-25 LAB — CREATININE, SERUM
Creatinine, Ser: 1.46 mg/dL — ABNORMAL HIGH (ref 0.61–1.24)
GFR calc non Af Amer: 41 mL/min — ABNORMAL LOW (ref >60)
GFR, EST AFRICAN AMERICAN: 47 mL/min — AB (ref >60)

## 2017-08-25 MED ORDER — ATORVASTATIN CALCIUM 80 MG PO TABS: 80.0000 mg | ORAL_TABLET | Freq: Every day | ORAL | 0 refills | Status: DC

## 2017-08-25 MED ORDER — APIXABAN 5 MG PO TABS: 5.0000 mg | ORAL_TABLET | Freq: Two times a day (BID) | ORAL | 0 refills | Status: DC

## 2017-08-25 NOTE — Discharge Instructions (Signed)

## 2017-08-25 NOTE — Discharge Summary (Signed)
Name: Shane Prince MRN: [id number] DOB: 12/14/1926 84 y.o. PCP: Lonie Peakonroy, Nathan, PA-C  Date of Admission: 08/23/2017 11:29 AM Date of Discharge: 08/25/2017 Attending Physician: No att. providers found  Discharge Diagnosis:    Acute CVA (cerebrovascular accident) [facility](HCC) Active Problems:   Hypertension   Atrial fibrillation (HCC)   Focal neurological deficit   Acute focal neurological deficit, onset within 3-24 hours   BPH (benign prostatic hyperplasia)   Normocytic anemia   Aortic stenosis   Discharge Medications: Allergies as of 08/25/2017   No Known Allergies     Medication List    STOP taking these medications   naproxen sodium 220 MG tablet Commonly known as:  ALEVE     TAKE these medications   apixaban 5 MG Tabs tablet Commonly known as:  ELIQUIS Take 1 tablet (5 mg total) by mouth 2 (two) times daily.   atorvastatin 80 MG tablet Commonly known as:  LIPITOR Take 1 tablet (80 mg total) by mouth daily at 6 PM.   finasteride 5 MG tablet Commonly known as:  PROSCAR Take 5 mg by mouth at bedtime.   losartan-hydrochlorothiazide 100-25 MG tablet Commonly known as:  HYZAAR Take 1 tablet by mouth at bedtime.   tamsulosin 0.4 MG Caps capsule Commonly known as:  FLOMAX Take 0.8 mg by mouth at bedtime.   tiZANidine 4 MG tablet Commonly known as:  ZANAFLEX Take 4 mg by mouth every 6 (six) hours as needed for muscle spasms.       Disposition and follow-up:   Mr.Shane Prince [facility] in Good condition.  At the hospital follow up visit please address:  1. Has he started eliquis, aspirin, and atorvastatin? Has AKI improved?   2.  Labs / imaging needed at time of follow-up: BMP  3.  Pending labs/ test needing follow-up: none  Follow-up Appointments:   Hospital Course by problem list:    Acute CVA (cerebrovascular accident) [facility](HCC) This is an 84 year old man with past medical history hypertension, BPH,  and osteoarthritis who presented with left-sided facial droop and slurred speech was found to have acute ischemic nonhemorrhagic small vessel subcortical infarct involving the posterior right frontal lobe. His symptom improved and at the time of discharge he had subtle left sided nasolabial fold flattening with strength intact and symmetric in upper and lower extremities bilaterally. He will need to follow up with outpatient OT and SLP. Started Atorvastatin 80 mg daily, aspirin 325 mg daily.     New Onset Atrial fibrillation (HCC) At the time of discharge he remained in afib. Rate was controlled without rate controlling medications CHA2DS2VASC score 5 with 7.2% yearly risk of stroke. He was started on Eliquis 5 mg BID which will need to be reduced if his creatinine rises above 1.5.     Hypertension Home med losartan-HCTZ was continued at the time of discharge.   Aortic stenosis  ECHOwith EF 60-65%, grade 1 diastolic dysfunction, mild to moderate aortic stenosis, no cardiac source of emboli   ICA stenosis  Carotid U/S 1-39% ICA stenosis.  Acute kidney injury  Unknown if this is CKD, no labs available since 2015 in this system.  initial creatinine 1.74,improved with gentle fluid hydration to 1.46    Normocytic anemia  Hgb 10.5, MCV 86.7, seems to be at patients baseline from prior labs.     Discharge Vitals:   BP 129/81 (BP Location: Left Arm)   Pulse 95   Temp 98.7 F (37.1 C) (  Oral)   Resp 18   Ht 6' (1.829 m)   Wt 224 lb 6.9 oz (101.8 kg)   SpO2 99%   BMI 30.44 kg/m   Pertinent Labs, Studies, and Procedures:  Lipid panel LDL 86, HDL 41 A1C 5.0  echocardiogram - Left ventricle: The cavity size was normal. Systolic function was   normal. The estimated ejection fraction was in the range of 60%   to 65%. Wall motion was normal; there were no regional wall   motion abnormalities. Doppler parameters are consistent with   abnormal left ventricular relaxation (grade 1  diastolic   dysfunction). - Aortic valve: Valve mobility was restricted. There was mild to   moderate stenosis. Peak velocity (S): 271 cm/s. Mean gradient   (S): 16 mm Hg. Valve area (VTI): 1.44 cm^2. Valve area (Vmax):   1.44 cm^2. Valve area (Vmean): 1.39 cm^2. - Mitral valve: Calcified annulus. There was trivial regurgitation. - Left atrium: The atrium was mildly dilated. Volume/bsa, ES   (1-plane Simpson&'s, A4C): 36.8 ml/m^2.  Carotid doppler Right Carotid: Velocities in the right ICA are consistent with a 1-39% stenosis.  Left Carotid: Velocities in the left ICA are consistent with a 1-39% stenosis.  Vertebrals: Bilateral vertebral arteries demonstrate antegrade flow. Subclavians: Normal flow hemodynamics were seen in bilateral subclavian       arteries.  Discharge Instructions: Discharge Instructions    Call MD for:  difficulty breathing, headache or visual disturbances   Complete by:  As directed    Call MD for:  persistant dizziness or light-headedness   Complete by:  As directed    Diet - low sodium heart healthy   Complete by:  As directed    Discharge instructions   Complete by:  As directed    - take eliquis twice daily, let your primary care doctor know about this new medication and that your kidney function was decreased this admission. Notify your doctor if you have uncontrolled bleeding.  - start taking lipitor daily  - continue to take your blood pressure medication losartan- HCTZ  - Physical therapy, occupational therapy, and speech therapy have recommended that you continue to have outpatient therapy  - tell your primary care doctor about the stenosis of your aortic valve ( mild to moderate )   Increase activity slowly   Complete by:  As directed       Signed: Eulah Pont, MD 08/28/2017, 5:28 PM   Pager: [phone]

## 2017-08-25 NOTE — Progress Notes (Signed)
   Subjective: Feeling well today, stroke workup is complete, no new complaints, eager for discharge home today   Objective:  Vital signs in last 24 hours: Vitals:   08/24/17 2228 08/25/17 0033 08/25/17 0408 08/25/17 0851  BP: (!) 141/76 (!) 141/83 (!) 159/80 129/81  Pulse: 73 61 95   Resp: 15 16 18    Temp:  98.5 F (36.9 C) 98.7 F (37.1 C) 98.7 F (37.1 C)  TempSrc:  Oral Oral Oral  SpO2: 99% 96% 99%   Weight:      Height:       Gen: well appearing, no acute distress  Neuro: CN II-XII grossly intact, strength in upper and lower ext intact  Cardiac: regular rate, irregular rhythm, no peripheral edema  Pulm: no resp distress, lungs clear to auscultation   Assessment/Plan:    Acute CVA (cerebrovascular accident) [facility](HCC) Patient presenting with acute onset left sided facial droop and slurred speech. MRI demonstrated 8 mm acute ischemic nonhemorrhagic small vessel type subcortical infarct involving the posterior right frontal lobe. Neuro exam today with improvement in deficits with minimal dysarthria and subtle left sided nasolabial fold flattening. Strength intact and symmetric in upper and lower extremities bilaterally. -ASA 325 mg daily -Lipitor 80 mg daily  -ECHO with EF 60-65%, grade 1 diastolic dysfunction, mild to moderate aortic stenosis, no cardiac source of emboli  -Carotid U/S pending 1-39% ICA stenosis. - Lipid panel WNL, A1C 5.0 -PT/OT/SLP  -outpt PT, OT, SLP recommended,     Atrial fibrillation (HCC) New onset,currentlyrate controlled.CHA2DS2VASc score of 5, 7.2% yearly risk of stroke. Will benefit from anticoagulation.  -TSH normal -Eliquis started 3/30 - rate controlled without rate control medications   HTN BP controlled 129/81 this morning. - continue home med losartan-HCTZ 100-25 mg   BPH -Continue home medications: flomax 0.8 mg daily and finasteride 5 mg at bedtime   Elevated Creatinine -Unknown if this is CKD, no labs available since 2015 in  this system  - initial creatinine 1.74, improved with gentle fluid hydration to 1.46   Normocytic anemia Hgb 10.5, MCV 86.7, seems to be at patients baseline from prior labs.   Dispo: Anticipated discharge in approximately today(s).   Eulah PontBlum, Gayland Nicol, MD 08/25/2017, 11:04 AM Pager: [phone]

## 2017-08-25 NOTE — Plan of Care (Signed)
  Problem: Education: Goal: Knowledge of General Education information will improve Outcome: Progressing   Problem: Nutrition: Goal: Adequate nutrition will be maintained Outcome: Progressing   Problem: Elimination: Goal: Will not experience complications related to bowel motility Outcome: Progressing   Problem: Skin Integrity: Goal: Risk for impaired skin integrity will decrease Outcome: Progressing   Problem: Ischemic Stroke/TIA Tissue Perfusion: Goal: Complications of ischemic stroke/TIA will be minimized Outcome: Progressing

## 2017-08-25 NOTE — Progress Notes (Signed)
Physical Therapy Treatment Patient Details Name: Shane Prince MRN: [id number] DOB: May 29, 1926 Today's Date: 08/25/2017    History of Present Illness Patient is an 84 y/o male who presents with dizziness, slurred speech and left facial droop. NIH:2. Admitted with new A-fib. MRI- 8 mm acute infarct posterior right frontal lobe. PMH includes HTN, peripheral neuropathy, lumbargo.    PT Comments    Patient progressing slowly towards PT goals. Continues to demonstrate poor safety awareness and poor awareness of any deficits despite being told by multiple therapists and imbalance. High fall risk. Pt impulsive. Encouraged pt to use RW for this session and it improved balance but pt reluctant to use it despite education on importance of reducing fall risk. Would highly recommend initial 24/7 supervision for safety for ~1-2 weeks and discharge recommendation updated to HHPT for home safety evaluation. Concerned pt will return to activities performed prior to CVA once returning to home environment and he is not safe to do this.  Will follow.   Follow Up Recommendations  Supervision for mobility/OOB;Home health PT;Supervision/Assistance - 24 hour     Equipment Recommendations  None recommended by PT    Recommendations for Other Services       Precautions / Restrictions Precautions Precautions: Fall Precaution Comments: A-fib Restrictions Weight Bearing Restrictions: No    Mobility  Bed Mobility Overal bed mobility: Modified Independent             General bed mobility comments: No assist needed.   Transfers Overall transfer level: Needs assistance Equipment used: None Transfers: Sit to/from Stand Sit to Stand: Min guard         General transfer comment: Min guard assist for safety.   Ambulation/Gait Ambulation/Gait assistance: Min guard Ambulation Distance (Feet): 175 Feet Assistive device: Rolling walker (2 wheeled) Gait Pattern/deviations: Step-through  pattern;Decreased stride length;Drifts right/left;Trunk flexed Gait velocity: decreased   General Gait Details: Slow, mildly unsteady gait using RW today. "this thing does not drive straight." 2/4 DOE. Balance improved with BUE support.   Stairs Stairs: Yes   Stair Management: Alternating pattern;Two rails Number of Stairs: 3(+ 2 steps x2 bouts) General stair comments: Cues to slow down and for safety/technique. impulsive.   Wheelchair Mobility    Modified Rankin (Stroke Patients Only) Modified Rankin (Stroke Patients Only) Pre-Morbid Rankin Score: Slight disability Modified Rankin: Moderately severe disability     Balance Overall balance assessment: Needs assistance;History of Falls Sitting-balance support: Feet supported;No upper extremity supported Sitting balance-Leahy Scale: Good     Standing balance support: During functional activity Standing balance-Leahy Scale: Fair                              Cognition Arousal/Alertness: Awake/alert Behavior During Therapy: Impulsive Overall Cognitive Status: Impaired/Different from baseline Area of Impairment: Attention;Memory;Following commands;Safety/judgement;Awareness;Problem solving                   Current Attention Level: Selective Memory: Decreased short-term memory Following Commands: Follows multi-step commands inconsistently Safety/Judgement: Decreased awareness of safety;Decreased awareness of deficits Awareness: Emergent Problem Solving: Slow processing General Comments: Pt with decreased safety awareness and impulsivity. Declines any changes with balance/mobility. When asked about RW for balance, "it makes it worse."       Exercises      General Comments        Pertinent Vitals/Pain Pain Assessment: No/denies pain    Home Living  Prior Function            PT Goals (current goals can now be found in the care plan section) Progress towards PT  goals: Progressing toward goals    Frequency    Min 4X/week      PT Plan Discharge plan needs to be updated    Co-evaluation              AM-PAC PT "6 Clicks" Daily Activity  Outcome Measure  Difficulty turning over in bed (including adjusting bedclothes, sheets and blankets)?: None Difficulty moving from lying on back to sitting on the side of the bed? : None Difficulty sitting down on and standing up from a chair with arms (e.g., wheelchair, bedside commode, etc,.)?: None Help needed moving to and from a bed to chair (including a wheelchair)?: None Help needed walking in hospital room?: A Little Help needed climbing 3-5 steps with a railing? : A Little 6 Click Score: 22    End of Session Equipment Utilized During Treatment: Gait belt Activity Tolerance: Patient tolerated treatment well Patient left: in bed;with call bell/phone within reach;with bed alarm set Nurse Communication: Mobility status PT Visit Diagnosis: Unsteadiness on feet (R26.81);Difficulty in walking, not elsewhere classified (R26.2)     Time: [id number] PT Time Calculation (min) (ACUTE ONLY): 16 min  Charges:  $Gait Training: 8-22 mins                    G Codes:       Mylo RedShauna Camari Quintanilla, PT, DPT [phone]     Blake DivineShauna A Jaspal Pultz 08/25/2017, 9:23 AM

## 2017-08-28 DIAGNOSIS — N179 Acute kidney failure, unspecified: Secondary | ICD-10-CM

## 2017-08-28 DIAGNOSIS — I351 Nonrheumatic aortic (valve) insufficiency: Secondary | ICD-10-CM

## 2017-08-30 DIAGNOSIS — I10 Essential (primary) hypertension: Secondary | ICD-10-CM | POA: Diagnosis not present

## 2017-08-30 DIAGNOSIS — Z6830 Body mass index (BMI) 30.0-30.9, adult: Secondary | ICD-10-CM | POA: Diagnosis not present

## 2017-08-30 DIAGNOSIS — N179 Acute kidney failure, unspecified: Secondary | ICD-10-CM | POA: Diagnosis not present

## 2017-08-30 DIAGNOSIS — I4891 Unspecified atrial fibrillation: Secondary | ICD-10-CM | POA: Diagnosis not present

## 2017-08-30 DIAGNOSIS — I350 Nonrheumatic aortic (valve) stenosis: Secondary | ICD-10-CM | POA: Diagnosis not present

## 2017-08-30 DIAGNOSIS — E7800 Pure hypercholesterolemia, unspecified: Secondary | ICD-10-CM | POA: Diagnosis not present

## 2017-08-30 DIAGNOSIS — I639 Cerebral infarction, unspecified: Secondary | ICD-10-CM | POA: Diagnosis not present

## 2017-08-30 DIAGNOSIS — I6523 Occlusion and stenosis of bilateral carotid arteries: Secondary | ICD-10-CM | POA: Diagnosis not present

## 2017-08-31 NOTE — Progress Notes (Deleted)
Cardiology Office Note  Date:  08/31/2017   ID:  Shane Prince, DOB [birth year], MRN [id number]  PCP:  Lonie Peak, PA-C   No chief complaint on file.   HPI:  84 year old man with past medical history hypertension, BPH, and osteoarthritis who presented with left-sided facial droop and slurred speech was found to have acute ischemic nonhemorrhagic small vessel subcortical infarct involving the posterior right frontal lobe  CHA2DS2VASC score 5 with 7.2% yearly risk of stroke.  Acute CVA (cerebrovascular accident) [facility]) Active Problems:   Hypertension   Atrial fibrillation (HCC)   Focal neurological deficit   Acute focal neurological deficit, onset within 3-24 hours   BPH (benign prostatic hyperplasia)   Normocytic anemia   Aortic stenosis  Carotid U/S1-39% ICA stenosis. ECHOwith EF 60-65%, grade 1 diastolic dysfunction, mild to moderate aortic stenosis,       PMH:   has a past medical history of AK (actinic keratosis) (2014), Arthritis, HOH (hard of hearing) (left), Hypertension, Imbalance, Lumbago, and Peripheral neuropathy.  PSH:    Past Surgical History:  Procedure Laterality Date  . BACK SURGERY  2007   L3-4,5  . CARPAL TUNNEL RELEASE Left 2012  . CARPAL TUNNEL RELEASE  11/27/2011   Procedure: CARPAL TUNNEL RELEASE;  Surgeon: Drucilla Schmidt, MD;  Service: Orthopedics;  Laterality: Right;  . CATARACT EXTRACTION Bilateral 2008  . foot spur     toe  . KNEE ARTHROSCOPY Right 1960  . TOTAL HIP ARTHROPLASTY Right 2008  . TOTAL HIP ARTHROPLASTY Left 06/01/2013   Procedure: TOTAL HIP ARTHROPLASTY;  Surgeon: Nestor Lewandowsky, MD;  Location: [facility] OR;  Service: Orthopedics;  Laterality: Left;  . ULNAR NERVE TRANSPOSITION  11/27/2011   Procedure: ULNAR NERVE DECOMPRESSION/TRANSPOSITION;  Surgeon: Drucilla Schmidt, MD;  Location: [facility];  Service: Orthopedics;  Laterality: Right;  RIGHT ELBOW DECOMPRESSION OF THE ULNAR NERVE    Current Outpatient  Medications  Medication Sig Dispense Refill  . apixaban (ELIQUIS) 5 MG TABS tablet Take 1 tablet (5 mg total) by mouth 2 (two) times daily. 60 tablet 0  . atorvastatin (LIPITOR) 80 MG tablet Take 1 tablet (80 mg total) by mouth daily at 6 PM. 30 tablet 0  . finasteride (PROSCAR) 5 MG tablet Take 5 mg by mouth at bedtime.    Marland Kitchen losartan-hydrochlorothiazide (HYZAAR) 100-25 MG tablet Take 1 tablet by mouth at bedtime.     . tamsulosin (FLOMAX) 0.4 MG CAPS capsule Take 0.8 mg by mouth at bedtime.     Marland Kitchen tiZANidine (ZANAFLEX) 4 MG tablet Take 4 mg by mouth every 6 (six) hours as needed for muscle spasms.     No current facility-administered medications for this visit.      Allergies:   Patient has no known allergies.   Social History:  The patient  reports that he has never smoked. He has never used smokeless tobacco. He reports that he drinks about 4.2 oz of alcohol per week. He reports that he does not use drugs.   Family History:   family history includes CAD in his father; Cancer in his mother.    Review of Systems: ROS   PHYSICAL EXAM: VS:  There were no vitals taken for this visit. , BMI There is no height or weight on file to calculate BMI. GEN: Well nourished, well developed, in no acute distress HEENT: normal Neck: no JVD, carotid bruits, or masses Cardiac: RRR; no murmurs, rubs, or gallops,no edema  Respiratory:  clear to auscultation bilaterally,  normal work of breathing GI: soft, nontender, nondistended, + BS MS: no deformity or atrophy Skin: warm and dry, no rash Neuro:  Strength and sensation are intact Psych: euthymic mood, full affect    Recent Labs: 08/23/2017: TSH 1.736 08/24/2017: ALT 11; BUN 19; Hemoglobin 10.5; Platelets 146; Potassium 4.1; Sodium 138 08/25/2017: Creatinine, Ser 1.46    Lipid Panel Lab Results  Component Value Date   CHOL 147 08/24/2017   HDL 41 08/24/2017   LDLCALC 86 08/24/2017   TRIG 102 08/24/2017      Wt Readings from Last 3  Encounters:  08/24/17 224 lb 6.9 oz (101.8 kg)  04/26/14 250 lb 12 oz (113.7 kg)  12/21/13 240 lb 12 oz (109.2 kg)       ASSESSMENT AND PLAN:  No diagnosis found.   Disposition:   F/U  6 months  No orders of the defined types were placed in this encounter.    Signed, Dossie Arbourim Aneri Slagel, M.D., Ph.D. 08/31/2017  [facility], [place] [phone]

## 2017-09-02 ENCOUNTER — Telehealth: Payer: Self-pay | Admitting: Cardiovascular Disease

## 2017-09-02 ENCOUNTER — Encounter: Payer: Self-pay | Admitting: Cardiovascular Disease

## 2017-09-02 ENCOUNTER — Ambulatory Visit: Payer: Medicare PPO | Admitting: Cardiovascular Disease

## 2017-09-02 ENCOUNTER — Telehealth: Payer: Self-pay

## 2017-09-02 VITALS — BP 150/72 | HR 76 | Ht 72.0 in | Wt 222.0 lb

## 2017-09-02 DIAGNOSIS — N183 Chronic kidney disease, stage 3 unspecified: Secondary | ICD-10-CM

## 2017-09-02 DIAGNOSIS — I350 Nonrheumatic aortic (valve) stenosis: Secondary | ICD-10-CM | POA: Diagnosis not present

## 2017-09-02 DIAGNOSIS — I639 Cerebral infarction, unspecified: Secondary | ICD-10-CM

## 2017-09-02 DIAGNOSIS — I10 Essential (primary) hypertension: Secondary | ICD-10-CM | POA: Diagnosis not present

## 2017-09-02 DIAGNOSIS — I480 Paroxysmal atrial fibrillation: Secondary | ICD-10-CM | POA: Diagnosis not present

## 2017-09-02 MED ORDER — APIXABAN 5 MG PO TABS: 5.0000 mg | ORAL_TABLET | Freq: Two times a day (BID) | ORAL | 11 refills | Status: DC

## 2017-09-02 MED ORDER — METOPROLOL SUCCINATE ER 25 MG PO TB24: 25.0000 mg | ORAL_TABLET | Freq: Every day | ORAL | 3 refills | Status: AC

## 2017-09-02 MED ORDER — LOSARTAN POTASSIUM 100 MG PO TABS: 100.0000 mg | ORAL_TABLET | Freq: Every day | ORAL | 3 refills | Status: AC

## 2017-09-02 MED ORDER — ATORVASTATIN CALCIUM 80 MG PO TABS: 80.0000 mg | ORAL_TABLET | Freq: Every day | ORAL | 11 refills | Status: AC

## 2017-09-02 NOTE — Telephone Encounter (Signed)
Called to make patient aware that medication on list was discontinued (Losartan-hydrochlorothiazide) and started on just losartan. Left voicemail message to call back.

## 2017-09-02 NOTE — Patient Instructions (Addendum)
Compression hose when on your feet   Medication Instructions:  Your physician has recommended you make the following change in your medication:  1. START Metoprolol 25 mg once a day for atrial fibrillation 2. STOP Losartan-Hydrochlorothiazide 3. START Losartan 100 mg once daily 4. CONTINUE Eliquis 5 mg twice a day  Try cetirazine, generic for zyrtec, allergy pill (for nose drip)  Labwork:  No new labs needed  Testing/Procedures:  No further testing at this time  Follow-Up: It was a pleasure seeing you in the office today. Please call us if you have new issues that need to be addressed before your next appt.  [phone]  Your physician wants you to follow-up in: 6 months.  You will receive a reminder letter in the mail two months in advance. If you don't receive a letter, please call our office to schedule the follow-up appointment.  If you need a refill on your cardiac medications before your next appointment, please call your pharmacy.  For educational health videos Log in to : [email or web address] Or : [email or web address], password : [email or web address]

## 2017-09-02 NOTE — Telephone Encounter (Signed)
Attempted to r/s sooner appt to 4/8 per Dr. Mariah MillingGollan.  Patient is attempting to get transportation will call back at 2

## 2017-09-02 NOTE — Progress Notes (Signed)
Cardiology Office Note  Date:  09/02/2017   ID:  Shane BroomsRobert Wilson Prince, DOB 1926-07-23, MRN [id number]  PCP:  Lonie Peakonroy, Nathan, PA-C   Chief Complaint  Patient presents with  . New Patient (Initial Visit)    New afib. started on eliquis. Recent [facility] admission for CVA, AFIB, HTN. Meds reviewed verbally with patient.     HPI:  84 year old man with past medical history of Acute CVA (cerebrovascular accident) March 2019 Active Problems:   Hypertension   Atrial fibrillation (HCC)   Focal neurological deficit   Acute focal neurological deficit, onset within 3-24 hours   BPH (benign prostatic hyperplasia)   Normocytic anemia   Aortic stenosis osteoarthritis  who presented with left-sided facial droop and slurred speech was found to have acute ischemic nonhemorrhagic small vessel subcortical infarct involving the posterior right frontal lobe March 2019 Noted to have atrial fibrillation on EKG CHA2DS2VASC score 5 with 7.2% yearly risk of stroke. [facility]  Carotid U/S1-39% ICA stenosis. ECHOwith EF 60-65%, grade 1 diastolic dysfunction, mild to moderate aortic stenosis, D/C 08/25/2017  Reports that he feels well, back to his baseline Denies any tachycardia or palpitations concerning for atrial fibrillation Recently seen by primary care, Dr. Anna Genreonroy  Lab work reviewed from the hospital creatinine 1.7 on arrival, improved down to 1.46 at discharge Reports that he does not drink very much at baseline Currently on losartan HCTZ Chronic lower extremity edema, worse at the end of a long day Sleeps with his legs up  Denies any chest pain or shortness of breath  EKG personally reviewed by myself on todays visit Shows normal sinus rhythm rate 78 bpm right bundle branch block left anterior fascicular block   PMH:   has a past medical history of AK (actinic keratosis) (2014), Arthritis, HOH (hard of hearing) (left),  Hypertension, Imbalance, Lumbago, and Peripheral neuropathy.  PSH:    Past Surgical History:  Procedure Laterality Date  . BACK SURGERY  2007   L3-4,5  . CARPAL TUNNEL RELEASE Left 2012  . CARPAL TUNNEL RELEASE  11/27/2011   Procedure: CARPAL TUNNEL RELEASE;  Surgeon: Drucilla SchmidtJames P Aplington, MD;  Service: Orthopedics;  Laterality: Right;  . CATARACT EXTRACTION Bilateral 2008  . foot spur     toe  . KNEE ARTHROSCOPY Right 1960  . TOTAL HIP ARTHROPLASTY Right 2008  . TOTAL HIP ARTHROPLASTY Left 06/01/2013   Procedure: TOTAL HIP ARTHROPLASTY;  Surgeon: Nestor LewandowskyFrank J Rowan, MD;  Location: [facility];  Service: Orthopedics;  Laterality: Left;  . ULNAR NERVE TRANSPOSITION  11/27/2011   Procedure: ULNAR NERVE DECOMPRESSION/TRANSPOSITION;  Surgeon: Drucilla SchmidtJames P Aplington, MD;  Location: [facility];  Service: Orthopedics;  Laterality: Right;  RIGHT ELBOW DECOMPRESSION OF THE ULNAR NERVE    Current Outpatient Medications  Medication Sig Dispense Refill  . apixaban (ELIQUIS) 5 MG TABS tablet Take 1 tablet (5 mg total) by mouth 2 (two) times daily. 60 tablet 0  . atorvastatin (LIPITOR) 80 MG tablet Take 1 tablet (80 mg total) by mouth daily at 6 PM. 30 tablet 0  . finasteride (PROSCAR) 5 MG tablet Take 5 mg by mouth at bedtime.    Marland Kitchen. losartan-hydrochlorothiazide (HYZAAR) 100-25 MG tablet Take 1 tablet by mouth at bedtime.     . tamsulosin (FLOMAX) 0.4 MG CAPS capsule Take 0.8 mg by mouth at bedtime.     Marland Kitchen. tiZANidine (ZANAFLEX) 4 MG tablet Take 4 mg by mouth every 6 (six)  hours as needed for muscle spasms.    Marland Kitchen losartan (COZAAR) 100 MG tablet Take 1 tablet (100 mg total) by mouth daily. 90 tablet 3  . metoprolol succinate (TOPROL XL) 25 MG 24 hr tablet Take 1 tablet (25 mg total) by mouth daily. 90 tablet 3   No current facility-administered medications for this visit.      Allergies:   Patient has no known allergies.   Social History:  The patient  reports that he has never smoked. He has never used  smokeless tobacco. He reports that he drinks about 4.2 oz of alcohol per week. He reports that he does not use drugs.   Family History:   family history includes CAD in his father; Cancer in his mother.    Review of Systems: Review of Systems  Constitutional: Negative.   Respiratory: Negative.   Cardiovascular: Negative.   Gastrointestinal: Negative.   Musculoskeletal: Negative.   Neurological: Negative.   Psychiatric/Behavioral: Negative.   All other systems reviewed and are negative.    PHYSICAL EXAM: VS:  BP (!) 150/72 (BP Location: Left Arm, Patient Position: Sitting, Cuff Size: Normal)   Pulse 76   Ht 6' (1.829 m)   Wt 222 lb (100.7 kg)   BMI 30.11 kg/m  , BMI Body mass index is 30.11 kg/m. GEN: Well nourished, well developed, in no acute distress  HEENT: normal  Neck: no JVD, carotid bruits, or masses Cardiac: RRR; no murmurs, rubs, or gallops, trace bilateral lower extremity edema Respiratory:  clear to auscultation bilaterally, normal work of breathing GI: soft, nontender, nondistended, + BS MS: no deformity or atrophy  Skin: warm and dry, no rash Neuro:  Strength and sensation are intact Psych: euthymic mood, full affect  Recent Labs: 08/23/2017: TSH 1.736 08/24/2017: ALT 11; BUN 19; Hemoglobin 10.5; Platelets 146; Potassium 4.1; Sodium 138 08/25/2017: Creatinine, Ser 1.46    Lipid Panel Lab Results  Component Value Date   CHOL 147 08/24/2017   HDL 41 08/24/2017   LDLCALC 86 08/24/2017   TRIG 102 08/24/2017      Wt Readings from Last 3 Encounters:  09/02/17 222 lb (100.7 kg)  08/24/17 224 lb 6.9 oz (101.8 kg)  04/26/14 250 lb 12 oz (113.7 kg)     ASSESSMENT AND PLAN:  Paroxysmal atrial fibrillation (HCC) - Plan: EKG 12-Lead Long discussion with him concerning atrial fibrillation Seen on EKG in the hospital in the setting of stroke Discussed the importance of staying on his Eliquis 5 twice daily We will start metoprolol succinate 25 mg  daily  Acute CVA (cerebrovascular accident) (HCC) In the setting of paroxysmal atrial fibrillation On Eliquis 5 twice daily  Nonrheumatic aortic valve stenosis Mild to moderate on echocardiogram We will continue to monitor for now, no further workup needed  Essential hypertension Recommended he hold HCTZ given renal dysfunction, likely prerenal on hospital admission.  Does not drink very much during the daytime Will keep him on losartan 100 mg daily for blood pressure and renal protection  Chronic renal impairment, stage 3 (moderate) (HCC) Creatinine 1.7 on admission to the hospital down to 1.46 at discharge We will hold the HCTZ Continue losartan  Disposition:   F/U  6 months   Total encounter time more than 60 minutes  Greater than 50% was spent in counseling and coordination of care with the patient    Orders Placed This Encounter  Procedures  . EKG 12-Lead     Signed, Dossie Arbour, M.D., Ph.D. 09/02/2017  [facility]  [place], [place]

## 2017-09-03 NOTE — Telephone Encounter (Signed)
Spoke with patients daughter per release form and reviewed medication list that they were provided didn't have losartan-hydrochlorothiazide removed. She reviewed list and marked out medication and declined for me to send another to them via mail. She reports that they picked up new prescriptions and changes have been made. She verbalized understanding of our conversation and had no further questions at this time

## 2017-09-04 ENCOUNTER — Ambulatory Visit: Payer: Self-pay | Admitting: Cardiovascular Disease

## 2017-11-04 DIAGNOSIS — D649 Anemia, unspecified: Secondary | ICD-10-CM | POA: Diagnosis not present

## 2017-11-04 DIAGNOSIS — I10 Essential (primary) hypertension: Secondary | ICD-10-CM | POA: Diagnosis not present

## 2017-11-04 DIAGNOSIS — I959 Hypotension, unspecified: Secondary | ICD-10-CM | POA: Diagnosis not present

## 2017-11-04 DIAGNOSIS — I4891 Unspecified atrial fibrillation: Secondary | ICD-10-CM | POA: Diagnosis not present

## 2017-11-04 DIAGNOSIS — E7800 Pure hypercholesterolemia, unspecified: Secondary | ICD-10-CM | POA: Diagnosis not present

## 2017-11-04 DIAGNOSIS — I639 Cerebral infarction, unspecified: Secondary | ICD-10-CM | POA: Diagnosis not present

## 2017-11-04 DIAGNOSIS — N401 Enlarged prostate with lower urinary tract symptoms: Secondary | ICD-10-CM | POA: Diagnosis not present

## 2017-11-07 DIAGNOSIS — I4891 Unspecified atrial fibrillation: Secondary | ICD-10-CM | POA: Diagnosis not present

## 2017-11-07 DIAGNOSIS — I959 Hypotension, unspecified: Secondary | ICD-10-CM | POA: Diagnosis not present

## 2017-11-07 DIAGNOSIS — Z6829 Body mass index (BMI) 29.0-29.9, adult: Secondary | ICD-10-CM | POA: Diagnosis not present

## 2017-11-12 DIAGNOSIS — H18221 Idiopathic corneal edema, right eye: Secondary | ICD-10-CM | POA: Diagnosis not present

## 2017-11-15 DIAGNOSIS — H18221 Idiopathic corneal edema, right eye: Secondary | ICD-10-CM | POA: Diagnosis not present

## 2017-11-15 DIAGNOSIS — H40041 Steroid responder, right eye: Secondary | ICD-10-CM | POA: Diagnosis not present

## 2017-11-20 DIAGNOSIS — B0052 Herpesviral keratitis: Secondary | ICD-10-CM | POA: Diagnosis not present

## 2017-11-27 DIAGNOSIS — B0052 Herpesviral keratitis: Secondary | ICD-10-CM | POA: Diagnosis not present

## 2017-11-29 DIAGNOSIS — B0052 Herpesviral keratitis: Secondary | ICD-10-CM | POA: Diagnosis not present

## 2017-12-19 DIAGNOSIS — N401 Enlarged prostate with lower urinary tract symptoms: Secondary | ICD-10-CM | POA: Diagnosis not present

## 2017-12-19 DIAGNOSIS — I10 Essential (primary) hypertension: Secondary | ICD-10-CM | POA: Diagnosis not present

## 2017-12-19 DIAGNOSIS — I4891 Unspecified atrial fibrillation: Secondary | ICD-10-CM | POA: Diagnosis not present

## 2017-12-19 DIAGNOSIS — Z6829 Body mass index (BMI) 29.0-29.9, adult: Secondary | ICD-10-CM | POA: Diagnosis not present

## 2017-12-19 DIAGNOSIS — E7800 Pure hypercholesterolemia, unspecified: Secondary | ICD-10-CM | POA: Diagnosis not present

## 2017-12-19 DIAGNOSIS — N183 Chronic kidney disease, stage 3 (moderate): Secondary | ICD-10-CM | POA: Diagnosis not present

## 2017-12-19 DIAGNOSIS — M545 Low back pain: Secondary | ICD-10-CM | POA: Diagnosis not present

## 2017-12-19 DIAGNOSIS — M159 Polyosteoarthritis, unspecified: Secondary | ICD-10-CM | POA: Diagnosis not present

## 2017-12-19 DIAGNOSIS — Z1339 Encounter for screening examination for other mental health and behavioral disorders: Secondary | ICD-10-CM | POA: Diagnosis not present

## 2018-01-07 DIAGNOSIS — B0052 Herpesviral keratitis: Secondary | ICD-10-CM | POA: Diagnosis not present

## 2018-01-14 DIAGNOSIS — I4891 Unspecified atrial fibrillation: Secondary | ICD-10-CM | POA: Diagnosis not present

## 2018-01-14 DIAGNOSIS — R319 Hematuria, unspecified: Secondary | ICD-10-CM | POA: Diagnosis not present

## 2018-01-14 DIAGNOSIS — N401 Enlarged prostate with lower urinary tract symptoms: Secondary | ICD-10-CM | POA: Diagnosis not present

## 2018-01-14 DIAGNOSIS — N390 Urinary tract infection, site not specified: Secondary | ICD-10-CM | POA: Diagnosis not present

## 2018-01-21 DIAGNOSIS — H02135 Senile ectropion of left lower eyelid: Secondary | ICD-10-CM | POA: Diagnosis not present

## 2018-01-21 DIAGNOSIS — H02132 Senile ectropion of right lower eyelid: Secondary | ICD-10-CM | POA: Diagnosis not present

## 2018-01-22 DIAGNOSIS — H02125 Mechanical ectropion of left lower eyelid: Secondary | ICD-10-CM | POA: Diagnosis not present

## 2018-01-22 DIAGNOSIS — H16223 Keratoconjunctivitis sicca, not specified as Sjogren's, bilateral: Secondary | ICD-10-CM | POA: Diagnosis not present

## 2018-01-22 DIAGNOSIS — B0052 Herpesviral keratitis: Secondary | ICD-10-CM | POA: Diagnosis not present

## 2018-01-22 DIAGNOSIS — H02122 Mechanical ectropion of right lower eyelid: Secondary | ICD-10-CM | POA: Diagnosis not present

## 2018-01-22 DIAGNOSIS — Z961 Presence of intraocular lens: Secondary | ICD-10-CM | POA: Diagnosis not present

## 2018-01-22 DIAGNOSIS — I10 Essential (primary) hypertension: Secondary | ICD-10-CM | POA: Diagnosis not present

## 2018-02-19 DIAGNOSIS — H02106 Unspecified ectropion of left eye, unspecified eyelid: Secondary | ICD-10-CM | POA: Diagnosis not present

## 2018-02-19 DIAGNOSIS — H02132 Senile ectropion of right lower eyelid: Secondary | ICD-10-CM | POA: Diagnosis not present

## 2018-02-19 DIAGNOSIS — H02135 Senile ectropion of left lower eyelid: Secondary | ICD-10-CM | POA: Diagnosis not present

## 2018-02-19 DIAGNOSIS — Z01818 Encounter for other preprocedural examination: Secondary | ICD-10-CM | POA: Diagnosis not present

## 2018-03-31 DIAGNOSIS — H903 Sensorineural hearing loss, bilateral: Secondary | ICD-10-CM | POA: Diagnosis not present

## 2018-04-16 DIAGNOSIS — B0052 Herpesviral keratitis: Secondary | ICD-10-CM | POA: Diagnosis not present

## 2018-05-14 DIAGNOSIS — B0052 Herpesviral keratitis: Secondary | ICD-10-CM | POA: Diagnosis not present

## 2018-06-09 DIAGNOSIS — H02125 Mechanical ectropion of left lower eyelid: Secondary | ICD-10-CM | POA: Diagnosis not present

## 2018-06-09 DIAGNOSIS — H02122 Mechanical ectropion of right lower eyelid: Secondary | ICD-10-CM | POA: Diagnosis not present

## 2018-06-09 DIAGNOSIS — B0052 Herpesviral keratitis: Secondary | ICD-10-CM | POA: Diagnosis not present

## 2018-06-09 DIAGNOSIS — Z961 Presence of intraocular lens: Secondary | ICD-10-CM | POA: Diagnosis not present

## 2018-06-23 DIAGNOSIS — I4891 Unspecified atrial fibrillation: Secondary | ICD-10-CM | POA: Diagnosis not present

## 2018-06-23 DIAGNOSIS — M545 Low back pain: Secondary | ICD-10-CM | POA: Diagnosis not present

## 2018-06-23 DIAGNOSIS — N401 Enlarged prostate with lower urinary tract symptoms: Secondary | ICD-10-CM | POA: Diagnosis not present

## 2018-06-23 DIAGNOSIS — N183 Chronic kidney disease, stage 3 (moderate): Secondary | ICD-10-CM | POA: Diagnosis not present

## 2018-06-23 DIAGNOSIS — I10 Essential (primary) hypertension: Secondary | ICD-10-CM | POA: Diagnosis not present

## 2018-06-23 DIAGNOSIS — E7800 Pure hypercholesterolemia, unspecified: Secondary | ICD-10-CM | POA: Diagnosis not present

## 2018-07-07 DIAGNOSIS — H02125 Mechanical ectropion of left lower eyelid: Secondary | ICD-10-CM | POA: Diagnosis not present

## 2018-07-07 DIAGNOSIS — H02122 Mechanical ectropion of right lower eyelid: Secondary | ICD-10-CM | POA: Diagnosis not present

## 2018-07-07 DIAGNOSIS — Z961 Presence of intraocular lens: Secondary | ICD-10-CM | POA: Diagnosis not present

## 2018-07-07 DIAGNOSIS — B0052 Herpesviral keratitis: Secondary | ICD-10-CM | POA: Diagnosis not present

## 2018-10-19 ENCOUNTER — Other Ambulatory Visit: Payer: Self-pay | Admitting: Cardiovascular Disease

## 2018-10-21 NOTE — Telephone Encounter (Signed)
Please review for refill.  

## 2018-12-16 DIAGNOSIS — Z1331 Encounter for screening for depression: Secondary | ICD-10-CM | POA: Diagnosis not present

## 2018-12-16 DIAGNOSIS — I4891 Unspecified atrial fibrillation: Secondary | ICD-10-CM | POA: Diagnosis not present

## 2018-12-16 DIAGNOSIS — E7800 Pure hypercholesterolemia, unspecified: Secondary | ICD-10-CM | POA: Diagnosis not present

## 2018-12-16 DIAGNOSIS — G629 Polyneuropathy, unspecified: Secondary | ICD-10-CM | POA: Diagnosis not present

## 2018-12-16 DIAGNOSIS — M545 Low back pain: Secondary | ICD-10-CM | POA: Diagnosis not present

## 2018-12-16 DIAGNOSIS — N401 Enlarged prostate with lower urinary tract symptoms: Secondary | ICD-10-CM | POA: Diagnosis not present

## 2018-12-16 DIAGNOSIS — R350 Frequency of micturition: Secondary | ICD-10-CM | POA: Diagnosis not present

## 2018-12-16 DIAGNOSIS — Z139 Encounter for screening, unspecified: Secondary | ICD-10-CM | POA: Diagnosis not present

## 2018-12-16 DIAGNOSIS — I10 Essential (primary) hypertension: Secondary | ICD-10-CM | POA: Diagnosis not present

## 2018-12-16 DIAGNOSIS — Z9181 History of falling: Secondary | ICD-10-CM | POA: Diagnosis not present

## 2018-12-16 DIAGNOSIS — N183 Chronic kidney disease, stage 3 (moderate): Secondary | ICD-10-CM | POA: Diagnosis not present

## 2018-12-17 IMAGING — CT CT HEAD CODE STROKE
4 series · 16 of 47 positions shown, 18 images · non-contrast
Comparison: None.

CLINICAL DATA: Code stroke. Left-sided facial droop. Last seen
normal 2 hours ago.

EXAM:
CT HEAD WITHOUT CONTRAST
TECHNIQUE: Contiguous axial images were obtained from the base of the skull
through the vertex without intravenous contrast.

[Series 3: head wo · axial · 0.44mm/px · z∈[-112,+2]mm · 7 of 31 slices shown, 9 images]
[im 4/31  brain]
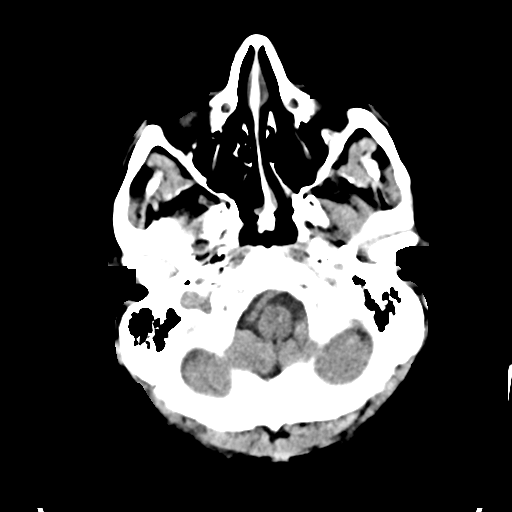
[im 4/31  bone]
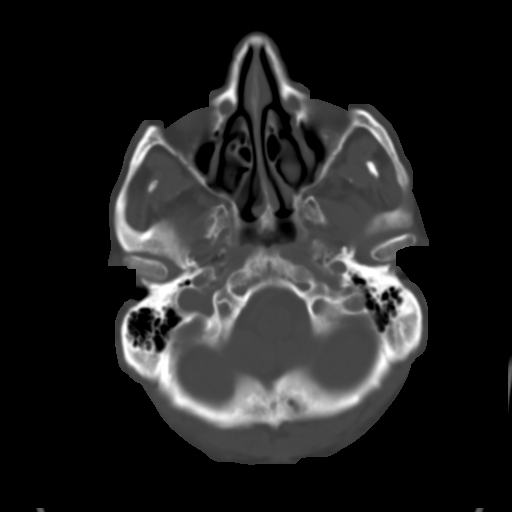
[im 8/31  brain]
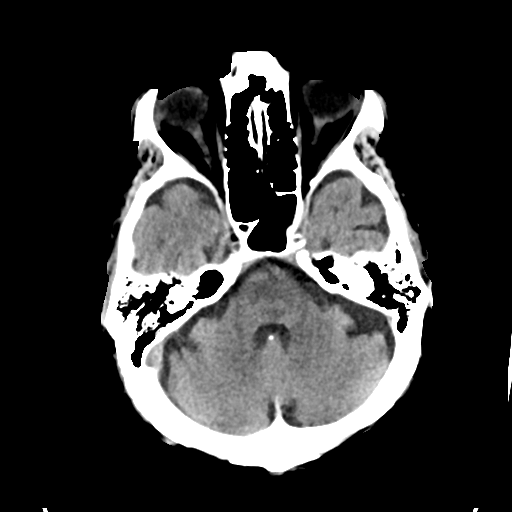
[im 12/31  brain]
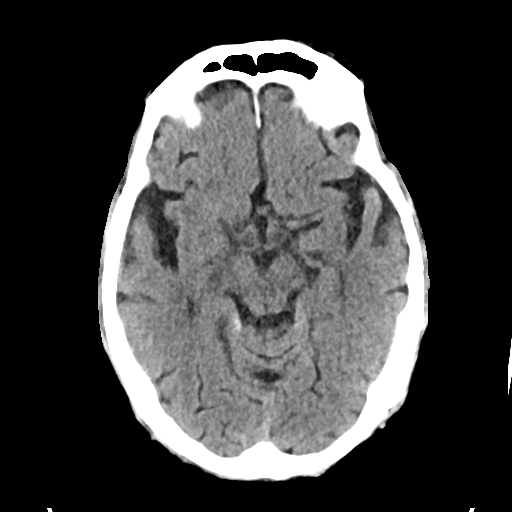
[im 16/31  brain]
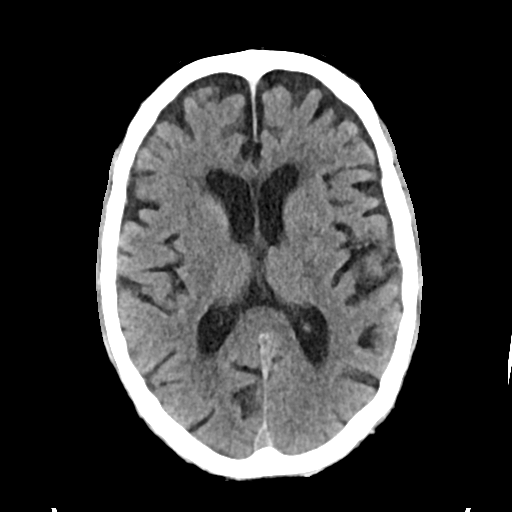
[im 19/31  brain]
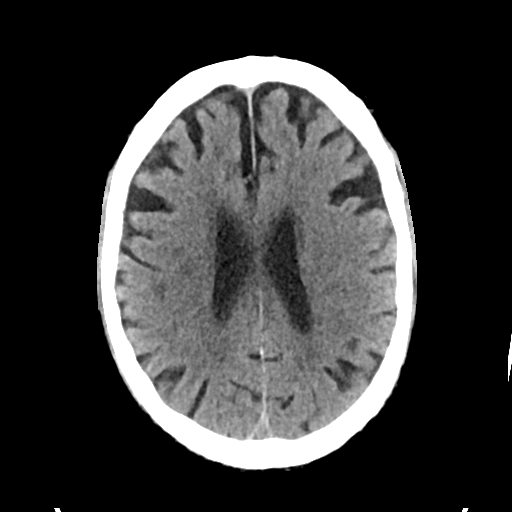
[im 19/31  bone]
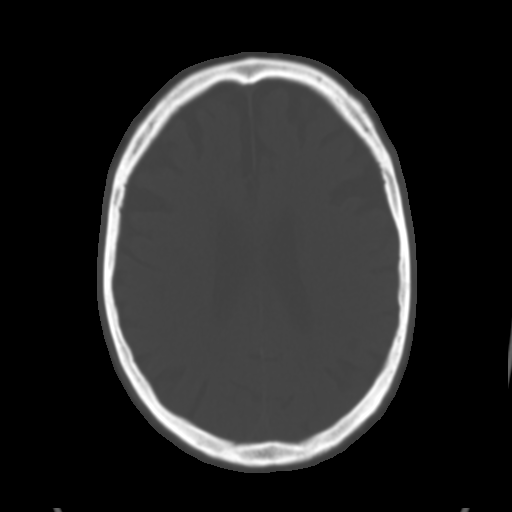
[im 23/31  brain]
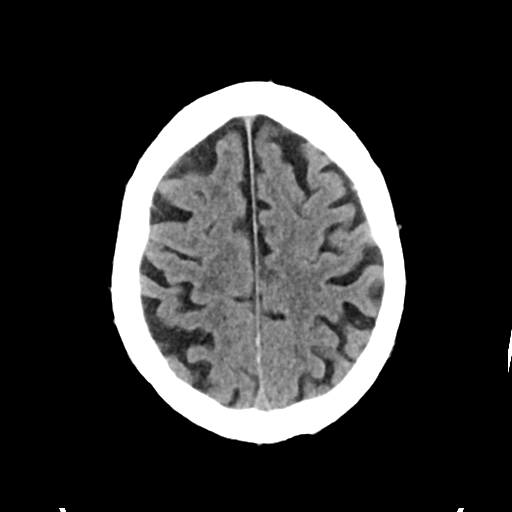
[im 27/31  brain]
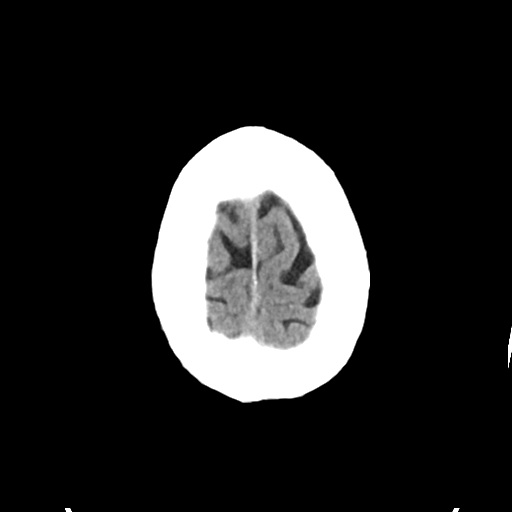

[Series 4: head bone · axial · 0.44mm/px · z∈[-114,-82]mm · 3 of 78 slices shown]
[im 8/78  bone]
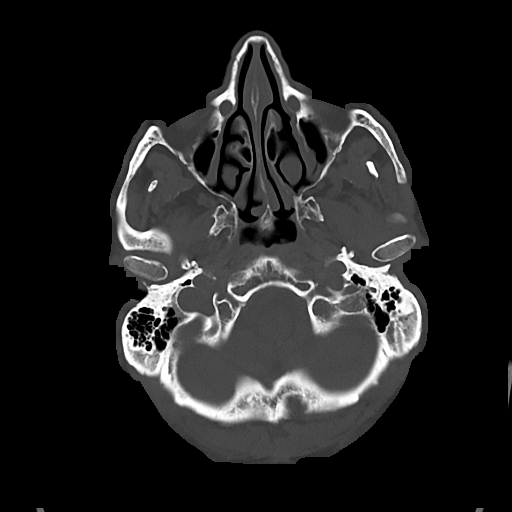
[im 16/78  bone]
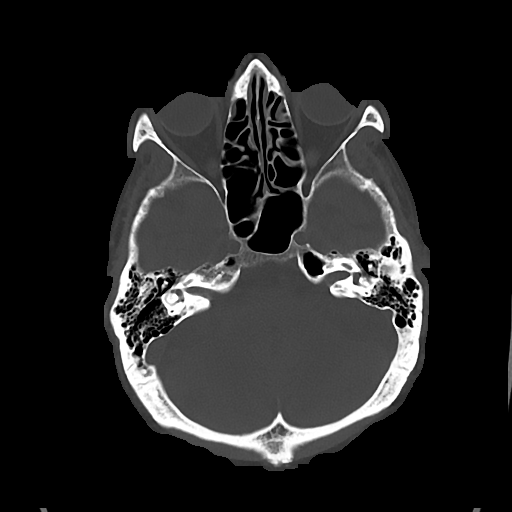
[im 24/78  bone]
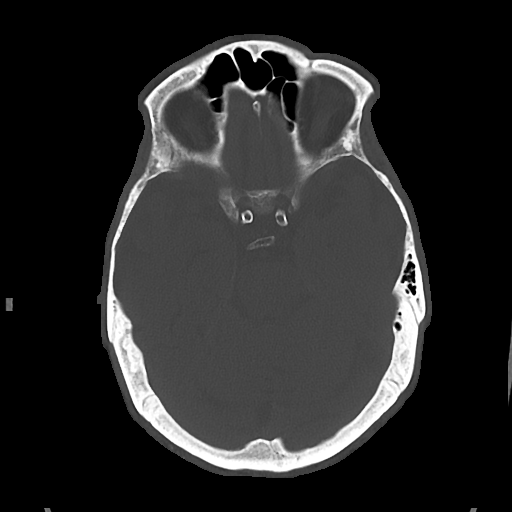

[Series 5: cor soft · coronal · 0.30mm/px · 3 of 67 slices shown]
[im 23/67  brain]
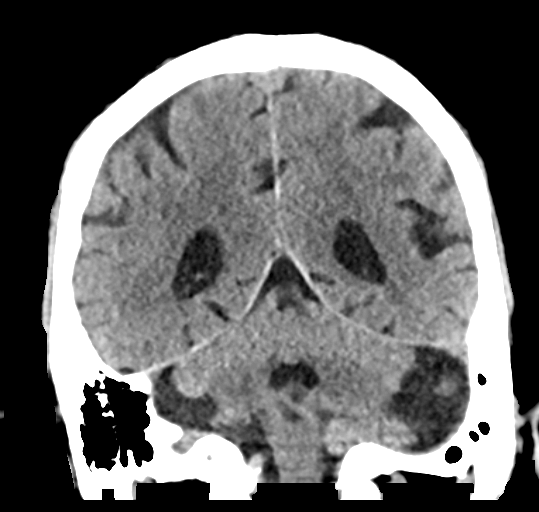
[im 30/67  brain]
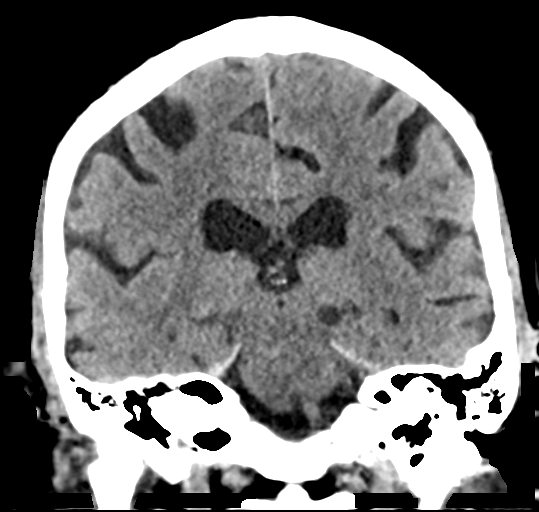
[im 37/67  brain]
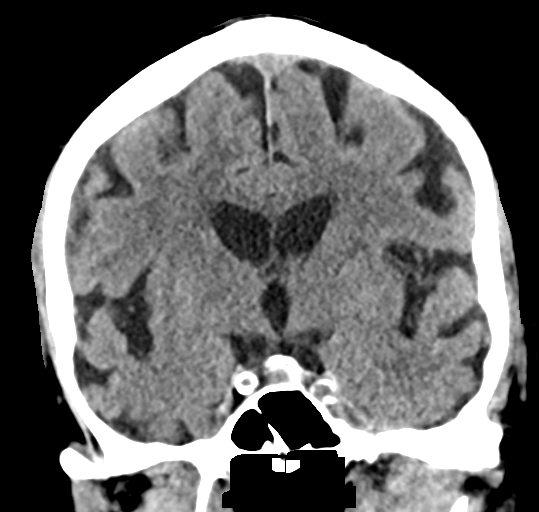

[Series 6: sag soft · sagittal · 0.30mm/px · 3 of 49 slices shown]
[im 17/49  brain]
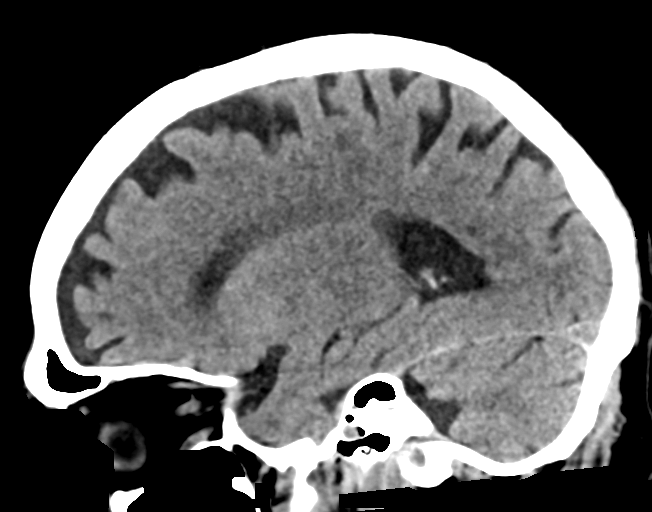
[im 25/49  brain]
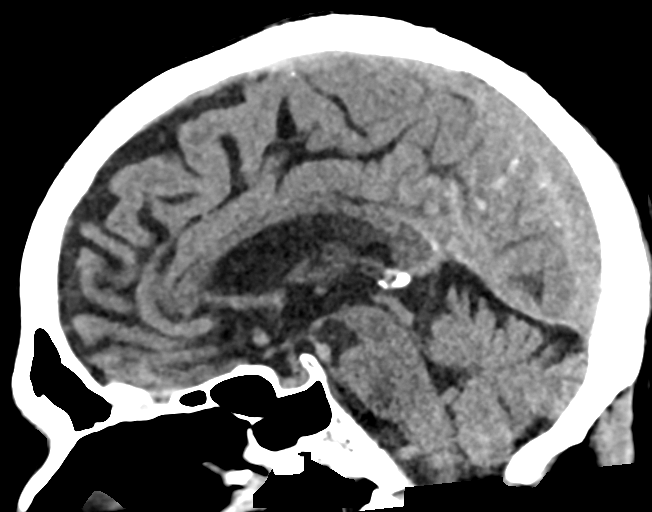
[im 33/49  brain]
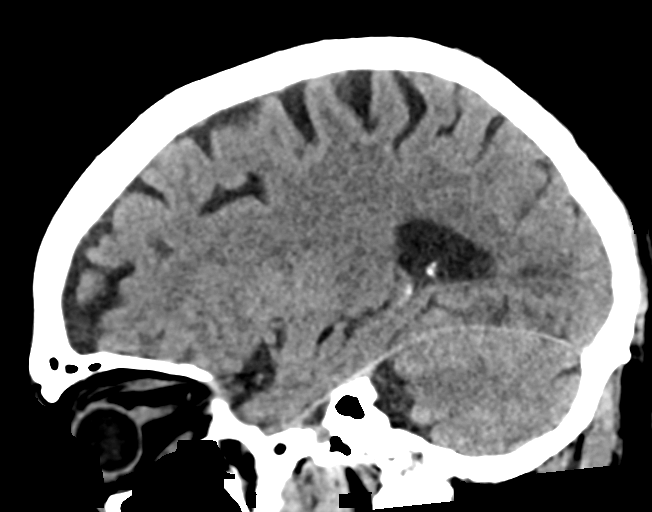

[16 of 47 positions shown; findings below may reference images not displayed]

FINDINGS: Brain: Mild atrophy and white matter changes are within normal
limits for age. Focal periventricular hypoattenuation within the
left centrum semi of bowel is likely chronic. No focal cortical
infarct is present. The basal ganglia are intact. Insular ribbon is
normal bilaterally. White matter hypoattenuation is present in the
midbrain and pons. The cerebellum is within normal limits.

Vascular: Atherosclerotic calcifications are present within the
cavernous internal carotid arteries bilaterally. There is no
hyperdense vessel.

Skull: Calvarium is intact. No focal lytic or blastic lesions are
present.

Sinuses/Orbits: A small polyp or mucous retention cyst is noted
posteriorly within the right maxillary sinus. The remaining
paranasal sinuses and the mastoid air cells are clear. Bilateral
lens replacements are present. Globes and orbits are otherwise
within normal limits.

ASPECTS (Alberta Stroke Program Early CT Score)

- Ganglionic level infarction (caudate, lentiform nuclei, internal
capsule, insula, M1-M3 cortex): [DATE]

- Supraganglionic infarction (M4-M6 cortex): [DATE]

Total score (0-10 with 10 being normal): [DATE]
IMPRESSION: 1. Mild generalized atrophy and white matter changes are largely
within normal limits for age.
2. Periventricular white matter hypoattenuation in the left centrum
semi of bowel is likely chronic.
3. No acute right-sided cortical or basal ganglia infarct.
4. Heterogeneous hypoattenuation within the brainstem is
nonspecific. Ischemia is not excluded.
5. ASPECTS is [DATE]

The above was relayed via text pager to Dr. HEINZ PELAEZ on

## 2019-03-30 DIAGNOSIS — Z6828 Body mass index (BMI) 28.0-28.9, adult: Secondary | ICD-10-CM | POA: Diagnosis not present

## 2019-03-30 DIAGNOSIS — M25511 Pain in right shoulder: Secondary | ICD-10-CM | POA: Diagnosis not present

## 2019-03-30 DIAGNOSIS — R252 Cramp and spasm: Secondary | ICD-10-CM | POA: Diagnosis not present

## 2019-03-30 DIAGNOSIS — Z23 Encounter for immunization: Secondary | ICD-10-CM | POA: Diagnosis not present

## 2019-06-18 DIAGNOSIS — R350 Frequency of micturition: Secondary | ICD-10-CM | POA: Diagnosis not present

## 2019-06-18 DIAGNOSIS — I10 Essential (primary) hypertension: Secondary | ICD-10-CM | POA: Diagnosis not present

## 2019-06-18 DIAGNOSIS — I4891 Unspecified atrial fibrillation: Secondary | ICD-10-CM | POA: Diagnosis not present

## 2019-06-18 DIAGNOSIS — N401 Enlarged prostate with lower urinary tract symptoms: Secondary | ICD-10-CM | POA: Diagnosis not present

## 2019-06-18 DIAGNOSIS — E7800 Pure hypercholesterolemia, unspecified: Secondary | ICD-10-CM | POA: Diagnosis not present

## 2019-06-18 DIAGNOSIS — N183 Chronic kidney disease, stage 3 unspecified: Secondary | ICD-10-CM | POA: Diagnosis not present

## 2019-06-18 DIAGNOSIS — M545 Low back pain: Secondary | ICD-10-CM | POA: Diagnosis not present

## 2019-06-18 DIAGNOSIS — M159 Polyosteoarthritis, unspecified: Secondary | ICD-10-CM | POA: Diagnosis not present

## 2019-06-18 DIAGNOSIS — Z139 Encounter for screening, unspecified: Secondary | ICD-10-CM | POA: Diagnosis not present

## 2019-06-18 DIAGNOSIS — Z9181 History of falling: Secondary | ICD-10-CM | POA: Diagnosis not present

## 2019-06-23 ENCOUNTER — Ambulatory Visit: Payer: Self-pay

## 2019-07-02 ENCOUNTER — Ambulatory Visit: Payer: Medicare PPO | Attending: Internal Medicine

## 2019-07-02 DIAGNOSIS — Z23 Encounter for immunization: Secondary | ICD-10-CM | POA: Insufficient documentation

## 2019-07-02 NOTE — Progress Notes (Signed)
   Covid-19 Vaccination Clinic  Name:  Shane Prince    MRN: [id number] DOB: Oct 18, 1926  07/02/2019  [name] was observed post Covid-19 immunization for 15 minutes without incidence. He was provided with Vaccine Information Sheet and instruction to access the V-Safe system.   [name] was instructed to call 911 with any severe reactions post vaccine: Marland Kitchen Difficulty breathing  . Swelling of your face and throat  . A fast heartbeat  . A bad rash all over your body  . Dizziness and weakness    Immunizations Administered    Name Date Dose VIS Date Route   Pfizer COVID-19 Vaccine 07/02/2019  9:55 AM 0.3 mL 05/08/2019 Intramuscular   Manufacturer: [employer], [employer]   Lot: [id number]   NDC: [address]-1

## 2019-07-27 ENCOUNTER — Ambulatory Visit: Payer: Medicare PPO | Attending: Internal Medicine

## 2019-07-27 DIAGNOSIS — Z23 Encounter for immunization: Secondary | ICD-10-CM

## 2019-07-27 NOTE — Progress Notes (Signed)
   Covid-19 Vaccination Clinic  Name:  Shane Prince    MRN: [id number] DOB: [birth year]  07/27/2019  [name] was observed post Covid-19 immunization for 15 minutes without incidence. He was provided with Vaccine Information Sheet and instruction to access the V-Safe system.   [name] was instructed to call 911 with any severe reactions post vaccine: Marland Kitchen Difficulty breathing  . Swelling of your face and throat  . A fast heartbeat  . A bad rash all over your body  . Dizziness and weakness    Immunizations Administered    Name Date Dose VIS Date Route   Pfizer COVID-19 Vaccine 07/27/2019  2:21 PM 0.3 mL 05/08/2019 Intramuscular   Manufacturer: [employer], [employer]   Lot: [id number]   NDC: [address]-4

## 2019-08-25 DIAGNOSIS — I10 Essential (primary) hypertension: Secondary | ICD-10-CM | POA: Diagnosis not present

## 2019-08-25 DIAGNOSIS — H35033 Hypertensive retinopathy, bilateral: Secondary | ICD-10-CM | POA: Diagnosis not present

## 2019-08-25 DIAGNOSIS — H524 Presbyopia: Secondary | ICD-10-CM | POA: Diagnosis not present

## 2019-08-25 DIAGNOSIS — H18413 Arcus senilis, bilateral: Secondary | ICD-10-CM | POA: Diagnosis not present

## 2019-08-25 DIAGNOSIS — H353230 Exudative age-related macular degeneration, bilateral, stage unspecified: Secondary | ICD-10-CM | POA: Diagnosis not present

## 2019-08-25 DIAGNOSIS — Z961 Presence of intraocular lens: Secondary | ICD-10-CM | POA: Diagnosis not present

## 2019-09-17 DIAGNOSIS — H35033 Hypertensive retinopathy, bilateral: Secondary | ICD-10-CM | POA: Diagnosis not present

## 2019-09-17 DIAGNOSIS — H35423 Microcystoid degeneration of retina, bilateral: Secondary | ICD-10-CM | POA: Diagnosis not present

## 2019-09-17 DIAGNOSIS — H43813 Vitreous degeneration, bilateral: Secondary | ICD-10-CM | POA: Diagnosis not present

## 2019-09-17 DIAGNOSIS — H353132 Nonexudative age-related macular degeneration, bilateral, intermediate dry stage: Secondary | ICD-10-CM | POA: Diagnosis not present

## 2019-10-20 DIAGNOSIS — Z6830 Body mass index (BMI) 30.0-30.9, adult: Secondary | ICD-10-CM | POA: Diagnosis not present

## 2019-10-20 DIAGNOSIS — G4700 Insomnia, unspecified: Secondary | ICD-10-CM | POA: Diagnosis not present

## 2019-10-20 DIAGNOSIS — I10 Essential (primary) hypertension: Secondary | ICD-10-CM | POA: Diagnosis not present

## 2019-10-20 DIAGNOSIS — R609 Edema, unspecified: Secondary | ICD-10-CM | POA: Diagnosis not present

## 2019-11-04 DIAGNOSIS — R609 Edema, unspecified: Secondary | ICD-10-CM | POA: Diagnosis not present

## 2019-11-04 DIAGNOSIS — R350 Frequency of micturition: Secondary | ICD-10-CM | POA: Diagnosis not present

## 2019-11-04 DIAGNOSIS — Z6831 Body mass index (BMI) 31.0-31.9, adult: Secondary | ICD-10-CM | POA: Diagnosis not present

## 2019-11-04 DIAGNOSIS — N401 Enlarged prostate with lower urinary tract symptoms: Secondary | ICD-10-CM | POA: Diagnosis not present

## 2019-11-04 DIAGNOSIS — G4700 Insomnia, unspecified: Secondary | ICD-10-CM | POA: Diagnosis not present

## 2019-11-04 DIAGNOSIS — R0602 Shortness of breath: Secondary | ICD-10-CM | POA: Diagnosis not present

## 2019-11-04 DIAGNOSIS — I10 Essential (primary) hypertension: Secondary | ICD-10-CM | POA: Diagnosis not present

## 2019-11-10 DIAGNOSIS — Z6830 Body mass index (BMI) 30.0-30.9, adult: Secondary | ICD-10-CM | POA: Diagnosis not present

## 2019-11-10 DIAGNOSIS — I4891 Unspecified atrial fibrillation: Secondary | ICD-10-CM | POA: Diagnosis not present

## 2019-11-10 DIAGNOSIS — Z79899 Other long term (current) drug therapy: Secondary | ICD-10-CM | POA: Diagnosis not present

## 2019-11-10 DIAGNOSIS — I509 Heart failure, unspecified: Secondary | ICD-10-CM | POA: Diagnosis not present

## 2019-11-10 DIAGNOSIS — I10 Essential (primary) hypertension: Secondary | ICD-10-CM | POA: Diagnosis not present

## 2019-11-19 DIAGNOSIS — I10 Essential (primary) hypertension: Secondary | ICD-10-CM | POA: Diagnosis not present

## 2019-11-19 DIAGNOSIS — I6340 Cerebral infarction due to embolism of unspecified cerebral artery: Secondary | ICD-10-CM | POA: Diagnosis not present

## 2019-11-19 DIAGNOSIS — R0602 Shortness of breath: Secondary | ICD-10-CM | POA: Diagnosis not present

## 2019-11-19 DIAGNOSIS — R600 Localized edema: Secondary | ICD-10-CM | POA: Diagnosis not present

## 2019-11-19 DIAGNOSIS — I350 Nonrheumatic aortic (valve) stenosis: Secondary | ICD-10-CM | POA: Diagnosis not present

## 2019-11-19 DIAGNOSIS — I5033 Acute on chronic diastolic (congestive) heart failure: Secondary | ICD-10-CM | POA: Diagnosis not present

## 2019-11-19 DIAGNOSIS — I482 Chronic atrial fibrillation, unspecified: Secondary | ICD-10-CM | POA: Diagnosis not present

## 2019-12-17 DIAGNOSIS — G4700 Insomnia, unspecified: Secondary | ICD-10-CM | POA: Diagnosis not present

## 2019-12-17 DIAGNOSIS — R350 Frequency of micturition: Secondary | ICD-10-CM | POA: Diagnosis not present

## 2019-12-17 DIAGNOSIS — I509 Heart failure, unspecified: Secondary | ICD-10-CM | POA: Diagnosis not present

## 2019-12-17 DIAGNOSIS — Z6829 Body mass index (BMI) 29.0-29.9, adult: Secondary | ICD-10-CM | POA: Diagnosis not present

## 2019-12-17 DIAGNOSIS — Z79899 Other long term (current) drug therapy: Secondary | ICD-10-CM | POA: Diagnosis not present

## 2019-12-17 DIAGNOSIS — N401 Enlarged prostate with lower urinary tract symptoms: Secondary | ICD-10-CM | POA: Diagnosis not present

## 2019-12-17 DIAGNOSIS — I10 Essential (primary) hypertension: Secondary | ICD-10-CM | POA: Diagnosis not present

## 2019-12-17 DIAGNOSIS — I4891 Unspecified atrial fibrillation: Secondary | ICD-10-CM | POA: Diagnosis not present

## 2020-01-01 DIAGNOSIS — N289 Disorder of kidney and ureter, unspecified: Secondary | ICD-10-CM | POA: Diagnosis not present

## 2020-01-13 DIAGNOSIS — I469 Cardiac arrest, cause unspecified: Secondary | ICD-10-CM | POA: Diagnosis not present

## 2020-01-27 DIAGNOSIS — 419620001 Death: Secondary | SNOMED CT | POA: Diagnosis not present

## 2020-01-27 DEATH — deceased
